# Patient Record
Sex: Female | Born: 1955
Health system: Southern US, Community
[De-identification: ages and names within clinical notes are randomized; demographics above are authoritative.]

## PROBLEM LIST (undated history)

## (undated) DIAGNOSIS — Z973 Presence of spectacles and contact lenses: Secondary | ICD-10-CM

## (undated) DIAGNOSIS — Z800 Family history of malignant neoplasm of digestive organs: Secondary | ICD-10-CM

## (undated) DIAGNOSIS — I10 Essential (primary) hypertension: Secondary | ICD-10-CM

## (undated) DIAGNOSIS — Z972 Presence of dental prosthetic device (complete) (partial): Secondary | ICD-10-CM

## (undated) HISTORY — PX: TUBAL LIGATION: SHX77

## (undated) HISTORY — PX: ORIF ANKLE FRACTURE: SUR919

## (undated) HISTORY — DX: Essential (primary) hypertension: I10

## (undated) HISTORY — PX: WISDOM TOOTH EXTRACTION: SHX21

## (undated) HISTORY — PX: COLONOSCOPY: SHX174

## (undated) HISTORY — PX: OTHER SURGICAL HISTORY: SHX169

## (undated) HISTORY — DX: Family history of malignant neoplasm of digestive organs: Z80.0

---

## 1987-06-05 HISTORY — PX: WRIST GANGLION EXCISION: SHX840

## 1998-12-08 ENCOUNTER — Other Ambulatory Visit: Admission: RE | Admit: 1998-12-08 | Discharge: 1998-12-08 | Payer: Self-pay | Admitting: Family Medicine

## 1998-12-29 ENCOUNTER — Encounter: Payer: Self-pay | Admitting: Family Medicine

## 1998-12-29 ENCOUNTER — Ambulatory Visit (HOSPITAL_COMMUNITY): Admission: RE | Admit: 1998-12-29 | Discharge: 1998-12-29 | Payer: Self-pay | Admitting: Family Medicine

## 1999-01-06 ENCOUNTER — Encounter: Payer: Self-pay | Admitting: Family Medicine

## 1999-01-06 ENCOUNTER — Ambulatory Visit (HOSPITAL_COMMUNITY): Admission: RE | Admit: 1999-01-06 | Discharge: 1999-01-06 | Payer: Self-pay | Admitting: Family Medicine

## 2000-08-08 ENCOUNTER — Encounter: Payer: Self-pay | Admitting: Family Medicine

## 2000-08-08 ENCOUNTER — Encounter: Admission: RE | Admit: 2000-08-08 | Discharge: 2000-08-08 | Payer: Self-pay | Admitting: Family Medicine

## 2001-02-06 ENCOUNTER — Other Ambulatory Visit: Admission: RE | Admit: 2001-02-06 | Discharge: 2001-02-06 | Payer: Self-pay | Admitting: Family Medicine

## 2001-02-18 ENCOUNTER — Encounter: Payer: Self-pay | Admitting: Family Medicine

## 2001-02-18 ENCOUNTER — Encounter: Admission: RE | Admit: 2001-02-18 | Discharge: 2001-02-18 | Payer: Self-pay | Admitting: Family Medicine

## 2002-05-04 ENCOUNTER — Other Ambulatory Visit: Admission: RE | Admit: 2002-05-04 | Discharge: 2002-05-04 | Payer: Self-pay | Admitting: Family Medicine

## 2002-12-31 ENCOUNTER — Other Ambulatory Visit: Admission: RE | Admit: 2002-12-31 | Discharge: 2002-12-31 | Payer: Self-pay | Admitting: *Deleted

## 2003-03-12 ENCOUNTER — Encounter: Payer: Self-pay | Admitting: Family Medicine

## 2003-03-12 ENCOUNTER — Encounter: Admission: RE | Admit: 2003-03-12 | Discharge: 2003-03-12 | Payer: Self-pay | Admitting: Family Medicine

## 2005-06-18 ENCOUNTER — Other Ambulatory Visit: Admission: RE | Admit: 2005-06-18 | Discharge: 2005-06-18 | Payer: Self-pay | Admitting: Family Medicine

## 2008-06-10 ENCOUNTER — Emergency Department (HOSPITAL_COMMUNITY): Admission: EM | Admit: 2008-06-10 | Discharge: 2008-06-10 | Payer: Self-pay | Admitting: Emergency Medicine

## 2008-06-14 ENCOUNTER — Ambulatory Visit: Payer: Self-pay | Admitting: Family Medicine

## 2008-06-21 ENCOUNTER — Ambulatory Visit: Payer: Self-pay | Admitting: Family Medicine

## 2008-07-26 ENCOUNTER — Ambulatory Visit: Payer: Self-pay | Admitting: Family Medicine

## 2008-08-23 ENCOUNTER — Ambulatory Visit: Payer: Self-pay | Admitting: Family Medicine

## 2008-08-24 ENCOUNTER — Encounter: Admission: RE | Admit: 2008-08-24 | Discharge: 2008-08-24 | Payer: Self-pay | Admitting: Family Medicine

## 2008-09-06 ENCOUNTER — Ambulatory Visit: Payer: Self-pay | Admitting: Family Medicine

## 2008-09-13 ENCOUNTER — Encounter: Admission: RE | Admit: 2008-09-13 | Discharge: 2008-09-13 | Payer: Self-pay | Admitting: Family Medicine

## 2008-12-13 ENCOUNTER — Ambulatory Visit: Payer: Self-pay | Admitting: Family Medicine

## 2009-01-13 ENCOUNTER — Ambulatory Visit: Payer: Self-pay | Admitting: Family Medicine

## 2009-04-14 ENCOUNTER — Ambulatory Visit: Payer: Self-pay | Admitting: Family Medicine

## 2009-07-25 ENCOUNTER — Ambulatory Visit: Payer: Self-pay | Admitting: Family Medicine

## 2009-09-29 IMAGING — US US ABDOMEN COMPLETE
1 series · 13 of 25 positions shown · non-contrast
Comparison: None.

CLINICAL DATA: Increased alkaline phosphatase.

ABDOMEN ULTRASOUND
TECHNIQUE: Complete abdominal ultrasound examination was performed
including evaluation of the liver, gallbladder, bile ducts,
pancreas, kidneys, spleen, IVC, and abdominal aorta.

[Series 1: us abdomen complete · 0.24mm/px · 13 of 63 slices shown]
[im 1/63]
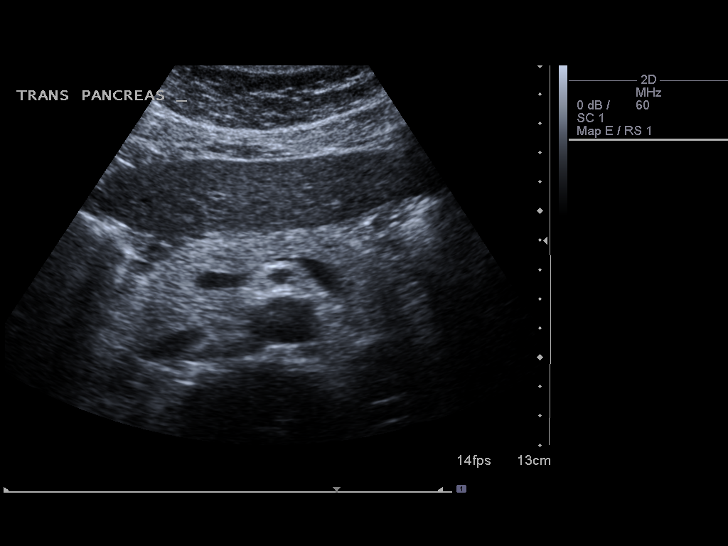
[im 6/63]
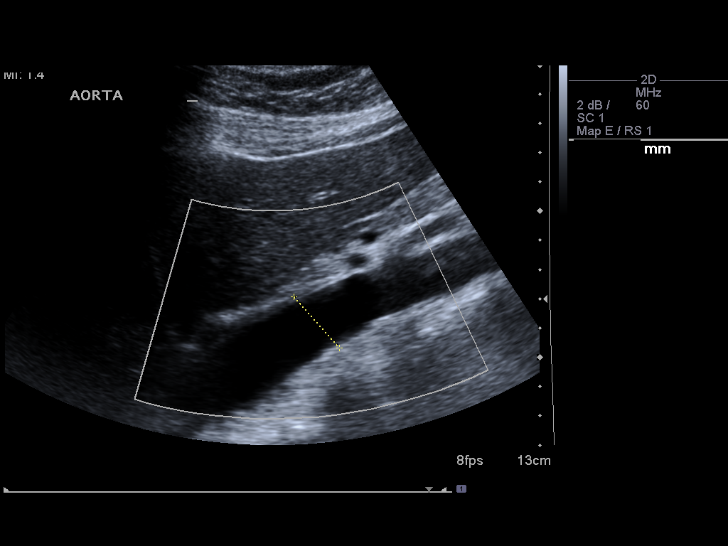
[im 11/63]
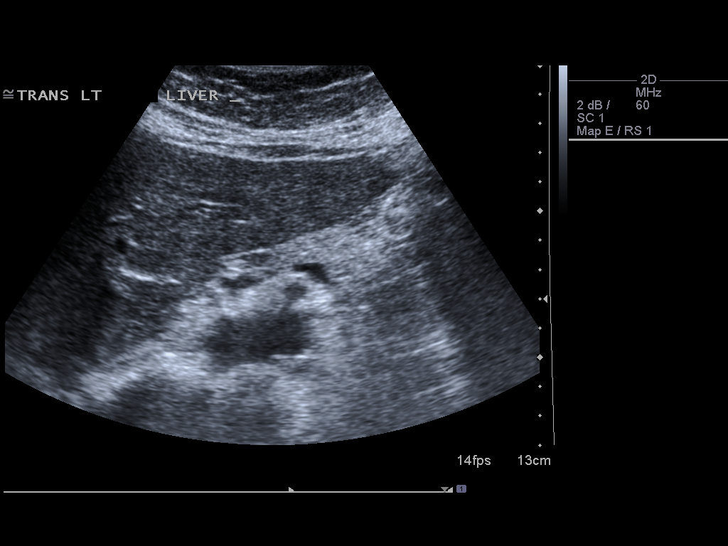
[im 16/63]
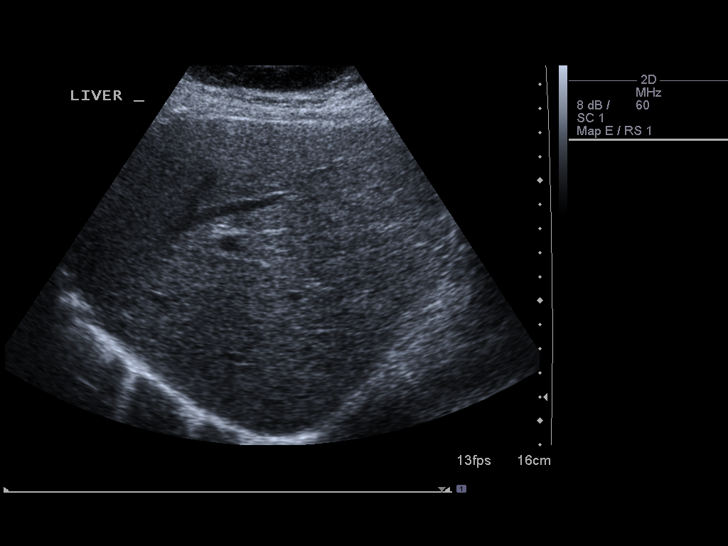
[im 21/63]
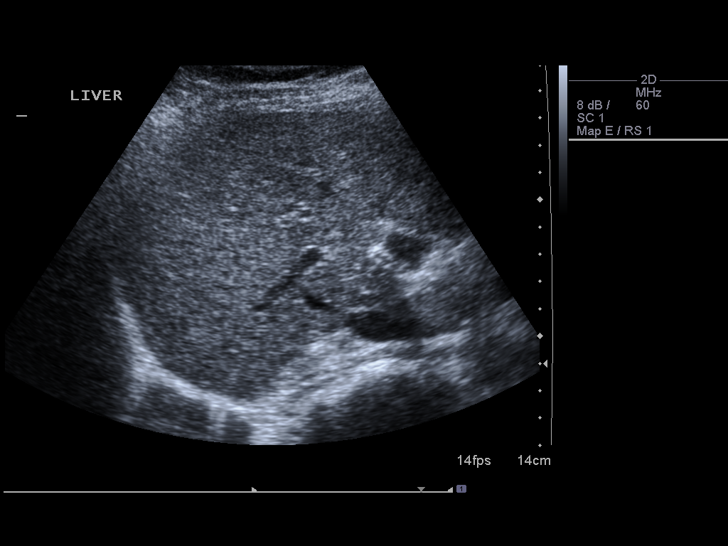
[im 26/63]
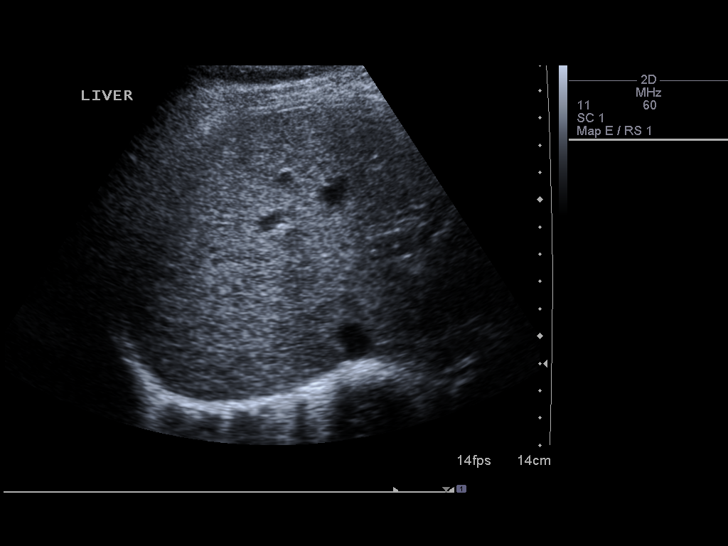
[im 32/63]
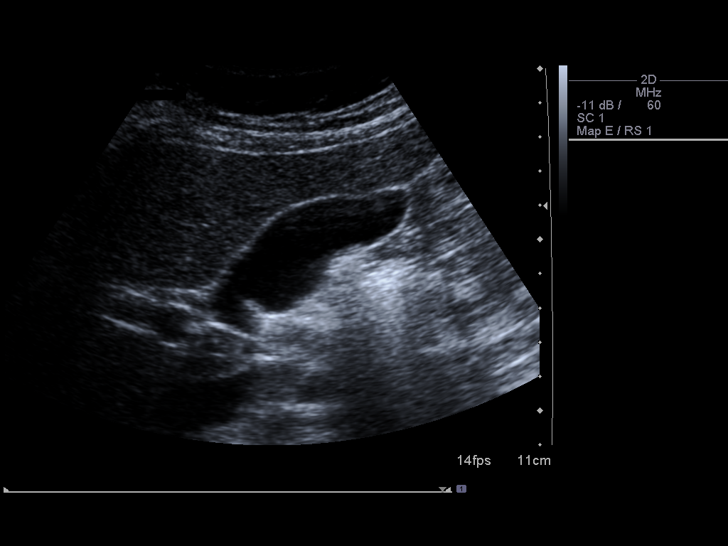
[im 37/63]
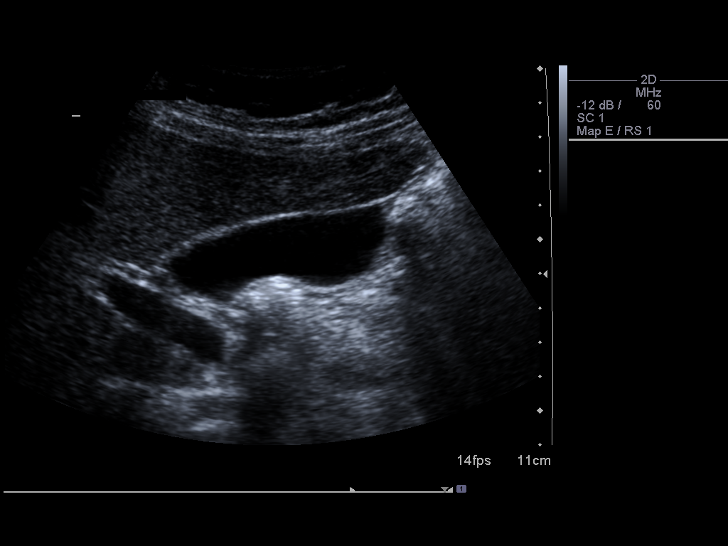
[im 42/63]
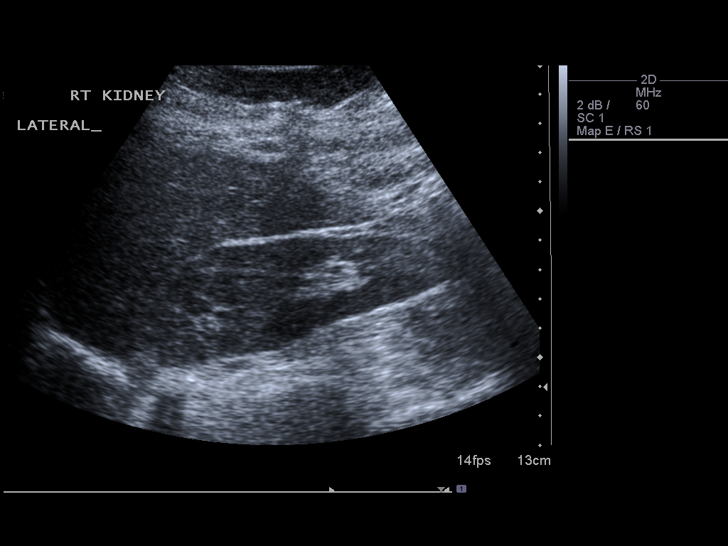
[im 47/63]
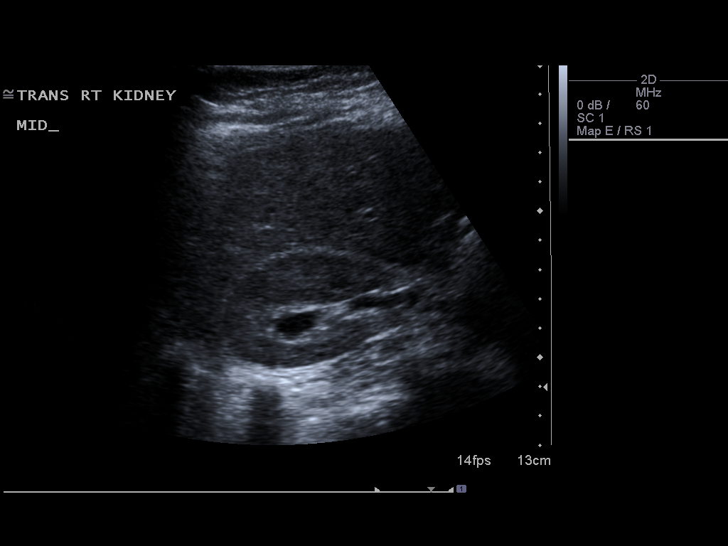
[im 52/63]
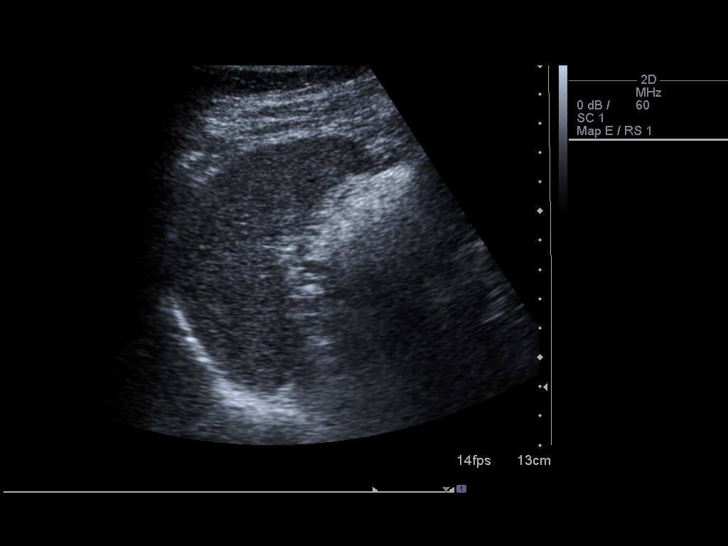
[im 57/63]
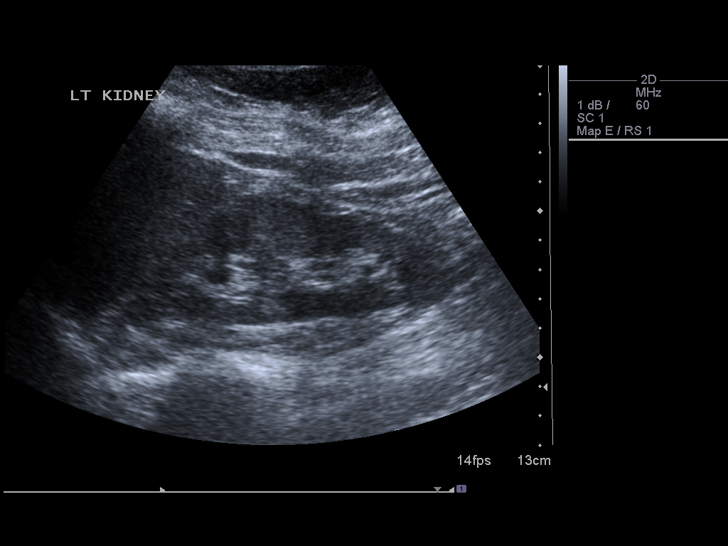
[im 63/63]
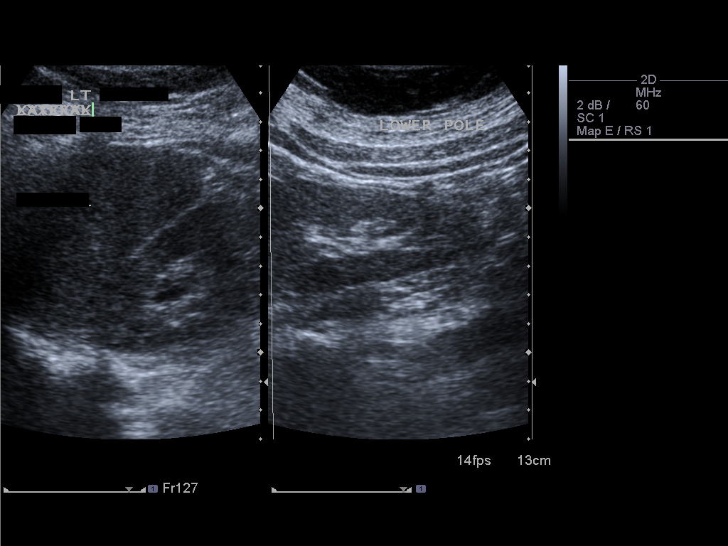

[13 of 25 positions shown; findings below may reference images not displayed]

FINDINGS: Gallbladder:  There is no evidence for gallstones, gallbladder wall
thickening or pericholecystic fluid.  The sonographer reports no
sonographic Murphy's sign.

Common Bile Duct:  Nondilated at 2-3 mm diameter

Liver:  There is some heterogeneity of the hepatic echotexture,
suggesting a degree of fatty infiltration.  No intrahepatic biliary
duct dilatation.

IVC:  Normal

Pancreas:  Normal

Spleen:  Normal

Right kidney:  11.1 cm in long axis.  Mild fullness of the right
intrarenal collecting system is noted with slight prominence of the
renal pelvis.

Left kidney:  11.5 cm in long axis.  Normal

Abdominal Aorta:  No evidence for aneurysm.
IMPRESSION: Normal gallbladder without evidence of biliary dilatation.

Heterogeneous hepatic echotexture suggests a degree of fatty
infiltration.

Mild fullness of the right intrarenal collecting system, a feature
of indeterminate significance.  No gross proximal hydroureter is
evident.  Correlation with renal function may prove helpful, and as
warranted, a follow-up ultrasound could be used to exclude
progression.

## 2010-09-18 LAB — POCT I-STAT, CHEM 8
BUN: 12 mg/dL (ref 6–23)
Calcium, Ion: 1.16 mmol/L (ref 1.12–1.32)
Chloride: 106 mEq/L (ref 96–112)
Potassium: 3.3 mEq/L — ABNORMAL LOW (ref 3.5–5.1)

## 2010-09-18 LAB — URINALYSIS, ROUTINE W REFLEX MICROSCOPIC
Glucose, UA: NEGATIVE mg/dL
Ketones, ur: NEGATIVE mg/dL
Specific Gravity, Urine: 1.012 (ref 1.005–1.030)
pH: 8 (ref 5.0–8.0)

## 2010-09-18 LAB — URINE MICROSCOPIC-ADD ON

## 2011-04-20 ENCOUNTER — Encounter: Payer: Self-pay | Admitting: Family Medicine

## 2011-06-18 ENCOUNTER — Ambulatory Visit (INDEPENDENT_AMBULATORY_CARE_PROVIDER_SITE_OTHER): Payer: 59 | Admitting: Family Medicine

## 2011-06-18 ENCOUNTER — Encounter: Payer: Self-pay | Admitting: Family Medicine

## 2011-06-18 ENCOUNTER — Ambulatory Visit: Payer: Self-pay | Admitting: Family Medicine

## 2011-06-18 ENCOUNTER — Other Ambulatory Visit: Payer: Self-pay | Admitting: Family Medicine

## 2011-06-18 DIAGNOSIS — E119 Type 2 diabetes mellitus without complications: Secondary | ICD-10-CM

## 2011-06-18 DIAGNOSIS — E1159 Type 2 diabetes mellitus with other circulatory complications: Secondary | ICD-10-CM

## 2011-06-18 DIAGNOSIS — I10 Essential (primary) hypertension: Secondary | ICD-10-CM

## 2011-06-18 DIAGNOSIS — E1169 Type 2 diabetes mellitus with other specified complication: Secondary | ICD-10-CM | POA: Insufficient documentation

## 2011-06-18 DIAGNOSIS — Z733 Stress, not elsewhere classified: Secondary | ICD-10-CM

## 2011-06-18 DIAGNOSIS — Z23 Encounter for immunization: Secondary | ICD-10-CM

## 2011-06-18 DIAGNOSIS — Z9119 Patient's noncompliance with other medical treatment and regimen: Secondary | ICD-10-CM

## 2011-06-18 DIAGNOSIS — I152 Hypertension secondary to endocrine disorders: Secondary | ICD-10-CM | POA: Insufficient documentation

## 2011-06-18 DIAGNOSIS — F439 Reaction to severe stress, unspecified: Secondary | ICD-10-CM

## 2011-06-18 LAB — LIPID PANEL
Cholesterol: 184 mg/dL (ref 0–200)
LDL Cholesterol: 107 mg/dL — ABNORMAL HIGH (ref 0–99)
Total CHOL/HDL Ratio: 4.3 Ratio
VLDL: 34 mg/dL (ref 0–40)

## 2011-06-18 LAB — CBC WITH DIFFERENTIAL/PLATELET
Basophils Absolute: 0 10*3/uL (ref 0.0–0.1)
Basophils Relative: 0 % (ref 0–1)
Eosinophils Absolute: 0.2 10*3/uL (ref 0.0–0.7)
Eosinophils Relative: 2 % (ref 0–5)
MCH: 28.6 pg (ref 26.0–34.0)
MCHC: 32.2 g/dL (ref 30.0–36.0)
MCV: 88.9 fL (ref 78.0–100.0)
Neutrophils Relative %: 43 % (ref 43–77)
Platelets: 253 10*3/uL (ref 150–400)
RBC: 4.23 MIL/uL (ref 3.87–5.11)
RDW: 13.2 % (ref 11.5–15.5)

## 2011-06-18 LAB — COMPREHENSIVE METABOLIC PANEL
ALT: 18 U/L (ref 0–35)
Alkaline Phosphatase: 165 U/L — ABNORMAL HIGH (ref 39–117)
Creat: 0.78 mg/dL (ref 0.50–1.10)
Sodium: 140 mEq/L (ref 135–145)
Total Bilirubin: 0.4 mg/dL (ref 0.3–1.2)
Total Protein: 7.4 g/dL (ref 6.0–8.3)

## 2011-06-18 MED ORDER — AMLODIPINE BESYLATE 10 MG PO TABS: 10.0000 mg | ORAL_TABLET | Freq: Every day | ORAL | Status: DC

## 2011-06-18 MED ORDER — OLMESARTAN MEDOXOMIL-HCTZ 40-12.5 MG PO TABS: 1.0000 | ORAL_TABLET | Freq: Every day | ORAL | Status: DC

## 2011-06-18 NOTE — Patient Instructions (Signed)
Get back on your blood pressure medications. Return at the end of the week for recheck and to review your bloodwork.

## 2011-06-18 NOTE — Progress Notes (Signed)
  Subjective:    Patient ID: Caswell Corwin, female    DOB: [birth year], 56 y.o.   MRN: [id number]  HPI She is here for consultation concerning recent blood pressure. Apparently was taken and she has readings in the 120 range. She stopped taking her blood pressure medication about a year ago. She states that she has been taking care of her mother who now has terminal cancer. Review of her record indicates she also has diabetes and has not followed up on this. When asked why she could not give me a good reason.   Review of Systems     Objective:   Physical Exam Alert and in no distress. Cardiac exam shows regular rhythm without murmurs or gallops. Lungs clear to auscultation. EKG       Assessment & Plan:   1. Diabetes mellitus  CBC with Differential, Comprehensive metabolic panel, Lipid panel, PR ELECTROCARDIOGRAM, COMPLETE, HgB A1c  2. Hypertension associated with diabetes  CBC with Differential, Comprehensive metabolic panel, Lipid panel, PR ELECTROCARDIOGRAM, COMPLETE  3. Stress    4. Personal history of noncompliance with medical treatment, presenting hazards to health      hospice will become involved in taking care of her mother. I strongly encouraged her to redirect her priorities and make sure that taking care of herself becomes her new main goal. Recheck here in several days.

## 2011-06-22 ENCOUNTER — Ambulatory Visit: Payer: 59 | Admitting: Family Medicine

## 2011-06-25 ENCOUNTER — Ambulatory Visit: Payer: 59 | Admitting: Family Medicine

## 2011-06-25 LAB — HEMOGLOBIN A1C: Mean Plasma Glucose: 192 mg/dL — ABNORMAL HIGH (ref <117)

## 2011-06-26 NOTE — Progress Notes (Signed)
Make sure that she has an appt

## 2011-07-13 ENCOUNTER — Ambulatory Visit (INDEPENDENT_AMBULATORY_CARE_PROVIDER_SITE_OTHER): Payer: 59 | Admitting: Family Medicine

## 2011-07-13 ENCOUNTER — Encounter: Payer: Self-pay | Admitting: Family Medicine

## 2011-07-13 DIAGNOSIS — I10 Essential (primary) hypertension: Secondary | ICD-10-CM

## 2011-07-13 DIAGNOSIS — E1169 Type 2 diabetes mellitus with other specified complication: Secondary | ICD-10-CM

## 2011-07-13 DIAGNOSIS — E1159 Type 2 diabetes mellitus with other circulatory complications: Secondary | ICD-10-CM

## 2011-07-13 DIAGNOSIS — I152 Hypertension secondary to endocrine disorders: Secondary | ICD-10-CM

## 2011-07-13 DIAGNOSIS — E119 Type 2 diabetes mellitus without complications: Secondary | ICD-10-CM

## 2011-07-13 NOTE — Progress Notes (Signed)
  Subjective:    Patient ID: Aimee Sharp, female    DOB: 01-Aug-1955, 56 y.o.   MRN: [id number]  HPI She is here for recheck. She continues on her blood pressure medication and is having no difficulty with this. She has been checking her blood sugar fairly regularly at home and has been getting readings in the 140/80-90 range. She keeps herself quite active with work. She does not smoke.   Review of Systems     Objective:   Physical Exam Alert and in no distress. Blood pressure is recorded.       Assessment & Plan:   1. Hypertension associated with diabetes   2. Diabetes mellitus    encouraged her to continue on her present medication regimen as well as physical activities. She does not eat much salt. She will return here in 3 months and if the blood pressure is not under good control, I LAD another medicine.

## 2011-07-13 NOTE — Patient Instructions (Signed)
Stay on all your medications and check you again in 3 months

## 2011-10-12 ENCOUNTER — Encounter: Payer: Self-pay | Admitting: Family Medicine

## 2011-10-12 ENCOUNTER — Ambulatory Visit (INDEPENDENT_AMBULATORY_CARE_PROVIDER_SITE_OTHER): Payer: 59 | Admitting: Family Medicine

## 2011-10-12 DIAGNOSIS — E1169 Type 2 diabetes mellitus with other specified complication: Secondary | ICD-10-CM

## 2011-10-12 DIAGNOSIS — I152 Hypertension secondary to endocrine disorders: Secondary | ICD-10-CM

## 2011-10-12 DIAGNOSIS — I10 Essential (primary) hypertension: Secondary | ICD-10-CM

## 2011-10-12 DIAGNOSIS — Z9119 Patient's noncompliance with other medical treatment and regimen: Secondary | ICD-10-CM

## 2011-10-12 DIAGNOSIS — Z79899 Other long term (current) drug therapy: Secondary | ICD-10-CM

## 2011-10-12 DIAGNOSIS — E785 Hyperlipidemia, unspecified: Secondary | ICD-10-CM | POA: Insufficient documentation

## 2011-10-12 DIAGNOSIS — E119 Type 2 diabetes mellitus without complications: Secondary | ICD-10-CM

## 2011-10-12 MED ORDER — ATORVASTATIN CALCIUM 20 MG PO TABS: 20.0000 mg | ORAL_TABLET | Freq: Every day | ORAL | Status: DC

## 2011-10-12 MED ORDER — CARVEDILOL 6.25 MG PO TABS: 6.2500 mg | ORAL_TABLET | Freq: Two times a day (BID) | ORAL | Status: DC

## 2011-10-12 MED ORDER — METFORMIN HCL 500 MG PO TABS: 500.0000 mg | ORAL_TABLET | Freq: Two times a day (BID) | ORAL | Status: DC

## 2011-10-12 NOTE — Progress Notes (Signed)
  Subjective:    Patient ID: Aimee Sharp, female    DOB: February 23, 1956, 56 y.o.   MRN: [id number]  HPI She is here for recheck on her blood pressure. Review of her record indicates she also has diabetes however she admits to not being compliant with her overall care. She cites taking care of her mother but does state that her mother died recently. She has been through nutritional counseling however eats out quite frequently and has made no changes in her diet habits. She does not check her blood sugars.   Review of Systems     Objective:   Physical Exam Alert and in no distress. Blood pressure is recorded.       Assessment & Plan:   1. Diabetes mellitus  metFORMIN (GLUCOPHAGE) 500 MG tablet  2. Hypertension associated with diabetes  carvedilol (COREG) 6.25 MG tablet  3. Hyperlipidemia LDL goal <70  atorvastatin (LIPITOR) 20 MG tablet  4. Encounter for long-term (current) use of other medications    5. Personal history of noncompliance with medical treatment, presenting hazards to health     over 20 minutes spent discussing diabetes care in regard to diet, exercise, medications. At this point she seems unwilling to make any major changes in her lifestyle. I will therefore add metformin, Lipitor and Coreg to her regimen. Recheck here in about one month .

## 2011-11-16 ENCOUNTER — Ambulatory Visit (INDEPENDENT_AMBULATORY_CARE_PROVIDER_SITE_OTHER): Payer: 59 | Admitting: Family Medicine

## 2011-11-16 VITALS — BP 126/80 | HR 80 | Wt 159.0 lb

## 2011-11-16 DIAGNOSIS — E1169 Type 2 diabetes mellitus with other specified complication: Secondary | ICD-10-CM

## 2011-11-16 DIAGNOSIS — I152 Hypertension secondary to endocrine disorders: Secondary | ICD-10-CM

## 2011-11-16 DIAGNOSIS — E119 Type 2 diabetes mellitus without complications: Secondary | ICD-10-CM

## 2011-11-16 DIAGNOSIS — I10 Essential (primary) hypertension: Secondary | ICD-10-CM

## 2011-11-16 MED ORDER — METFORMIN HCL ER (MOD) 500 MG PO TB24: 500.0000 mg | ORAL_TABLET | Freq: Every day | ORAL | Status: DC

## 2011-11-16 NOTE — Patient Instructions (Signed)
Stop the metformin that you're taking right now and start the new one can take at one twice a day. They on all your other medicines will see you in  3 months

## 2011-11-16 NOTE — Progress Notes (Signed)
  Subjective:    Patient ID: Aimee Sharp, female    DOB: 10-Apr-1956, 56 y.o.   MRN: [id number]  HPI She is here for recheck. She states that she is having difficulty from diarrhea otherwise seems to be doing fairly well.   Review of Systems     Objective:   Physical Exam Alert and in no distress otherwise not examined. Blood pressure is recorded.     Assessment & Plan:   1. Diabetes mellitus  metFORMIN (GLUMETZA) 500 MG (MOD) 24 hr tablet  2. Hypertension associated with diabetes     switch to metformin ER and continue other medications. Recheck here in about 3 months.

## 2011-11-19 ENCOUNTER — Telehealth: Payer: Self-pay | Admitting: Internal Medicine

## 2011-11-19 MED ORDER — METFORMIN HCL ER 500 MG PO TB24: 500.0000 mg | ORAL_TABLET | Freq: Every day | ORAL | Status: DC

## 2011-11-19 NOTE — Telephone Encounter (Signed)
Sent new med in

## 2011-11-19 NOTE — Telephone Encounter (Signed)
I want her to have a generic extended release metformin. Work with the pharmacy on this

## 2012-02-15 ENCOUNTER — Encounter: Payer: Self-pay | Admitting: Family Medicine

## 2012-02-15 ENCOUNTER — Ambulatory Visit (INDEPENDENT_AMBULATORY_CARE_PROVIDER_SITE_OTHER): Payer: 59 | Admitting: Family Medicine

## 2012-02-15 VITALS — BP 120/80 | HR 80 | Wt 159.0 lb

## 2012-02-15 DIAGNOSIS — E119 Type 2 diabetes mellitus without complications: Secondary | ICD-10-CM

## 2012-02-15 DIAGNOSIS — Z9119 Patient's noncompliance with other medical treatment and regimen: Secondary | ICD-10-CM

## 2012-02-15 LAB — POCT GLYCOSYLATED HEMOGLOBIN (HGB A1C): Hemoglobin A1C: 9.4

## 2012-02-15 MED ORDER — ALOGLIPTIN-PIOGLITAZONE 25-30 MG PO TABS: 1.0000 | ORAL_TABLET | ORAL | Status: DC

## 2012-02-15 NOTE — Patient Instructions (Signed)
Periodically check your blood sugars either before a meal or 2 hours after a meal. What your blood sugars 2 hours after a meal to be under 180 and before meals it should be in the low 100s

## 2012-02-15 NOTE — Progress Notes (Signed)
  Subjective:    Patient ID: Aimee Sharp, female    DOB: [birth year], 56 y.o.   MRN: [id number]  HPI She is here for recheck. She had difficulty with the metformin causing diarrhea in spite of being placed on a long-acting variety. She stopped the medication but did not call me. She has not been checking her blood sugars at all. She states her work keeps her busy physically. She continues on her other medications.   Review of Systems     Objective:   Physical Exam Alert and in no distress. Hemoglobin A1c is 9.4.      Assessment & Plan:   1. Diabetes mellitus  POCT glycosylated hemoglobin (Hb A1C)  2. Personal history of noncompliance with medical treatment, presenting hazards to health     I talked to her about noncompliance in regard to diet, exercise, checking her blood sugars. I will place her on Oseni. She is to call me if she has any difficulties. Discussed the need for her to periodically check her blood sugars. She stated that she was then have the nurse work help and I explained that it would be best for her to do this.

## 2012-03-29 ENCOUNTER — Other Ambulatory Visit: Payer: Self-pay | Admitting: Family Medicine

## 2012-05-16 ENCOUNTER — Ambulatory Visit: Payer: 59 | Admitting: Family Medicine

## 2012-05-26 ENCOUNTER — Ambulatory Visit (INDEPENDENT_AMBULATORY_CARE_PROVIDER_SITE_OTHER): Payer: 59 | Admitting: Family Medicine

## 2012-05-26 ENCOUNTER — Encounter: Payer: Self-pay | Admitting: Family Medicine

## 2012-05-26 VITALS — BP 114/80 | HR 75 | Ht 62.0 in | Wt 162.0 lb

## 2012-05-26 DIAGNOSIS — E1169 Type 2 diabetes mellitus with other specified complication: Secondary | ICD-10-CM

## 2012-05-26 DIAGNOSIS — I152 Hypertension secondary to endocrine disorders: Secondary | ICD-10-CM

## 2012-05-26 DIAGNOSIS — I10 Essential (primary) hypertension: Secondary | ICD-10-CM

## 2012-05-26 DIAGNOSIS — E119 Type 2 diabetes mellitus without complications: Secondary | ICD-10-CM

## 2012-05-26 DIAGNOSIS — E785 Hyperlipidemia, unspecified: Secondary | ICD-10-CM

## 2012-05-26 LAB — CBC WITH DIFFERENTIAL/PLATELET
Basophils Absolute: 0 10*3/uL (ref 0.0–0.1)
Basophils Relative: 1 % (ref 0–1)
HCT: 35.3 % — ABNORMAL LOW (ref 36.0–46.0)
Lymphocytes Relative: 53 % — ABNORMAL HIGH (ref 12–46)
MCHC: 33.1 g/dL (ref 30.0–36.0)
Neutro Abs: 2.5 10*3/uL (ref 1.7–7.7)
Neutrophils Relative %: 40 % — ABNORMAL LOW (ref 43–77)
RDW: 14.2 % (ref 11.5–15.5)
WBC: 6.1 10*3/uL (ref 4.0–10.5)

## 2012-05-26 LAB — LIPID PANEL
HDL: 45 mg/dL (ref >39)
LDL Cholesterol: 55 mg/dL (ref 0–99)
Triglycerides: 68 mg/dL (ref <150)
VLDL: 14 mg/dL (ref 0–40)

## 2012-05-26 LAB — COMPREHENSIVE METABOLIC PANEL
ALT: 13 U/L (ref 0–35)
AST: 16 U/L (ref 0–37)
Albumin: 4.3 g/dL (ref 3.5–5.2)
Alkaline Phosphatase: 124 U/L — ABNORMAL HIGH (ref 39–117)
Calcium: 9.8 mg/dL (ref 8.4–10.5)
Chloride: 104 mEq/L (ref 96–112)
Potassium: 3.7 mEq/L (ref 3.5–5.3)
Sodium: 141 mEq/L (ref 135–145)
Total Protein: 7.4 g/dL (ref 6.0–8.3)

## 2012-05-26 MED ORDER — ALOGLIPTIN-PIOGLITAZONE 25-30 MG PO TABS: 1.0000 | ORAL_TABLET | ORAL | Status: DC

## 2012-05-26 NOTE — Progress Notes (Signed)
  Subjective:    Patient ID: Aimee Sharp, female    DOB: 01/13/1956, 56 y.o.   MRN: [id number]  HPI She is here for recheck. She has not been checking her blood sugars stating she has had difficulty with her blood sugar machine. She has been taking her medications without any difficulty. She has no other concerns or complaints.   Review of Systems     Objective:   Physical Exam Alert and in no distress. Hemoglobin A1c is 7.1.       Assessment & Plan:   1. Diabetes mellitus  POCT glycosylated hemoglobin (Hb A1C), Alogliptin-Pioglitazone (OSENI) 25-30 MG TABS, Lipid panel, CBC with Differential, Comprehensive metabolic panel  2. Hypertension associated with diabetes  CBC with Differential, Comprehensive metabolic panel  3. Hyperlipidemia LDL goal <70  Lipid panel   I again stressed the need for her to become more proactive and she has difficulty with medications or with checking her blood sugars, she is to let us know. Otherwise I will see her back here in 4 months. My nurse did go for proper use of the glucometer with her.

## 2012-05-29 NOTE — Progress Notes (Signed)
Quick Note:  PT INFORMED AND VERBALIZED UNDERSTANDING  ______ 

## 2012-07-19 ENCOUNTER — Other Ambulatory Visit: Payer: Self-pay | Admitting: Family Medicine

## 2012-08-28 ENCOUNTER — Other Ambulatory Visit: Payer: Self-pay | Admitting: Family Medicine

## 2012-09-26 ENCOUNTER — Encounter: Payer: Self-pay | Admitting: Family Medicine

## 2012-09-26 ENCOUNTER — Ambulatory Visit (INDEPENDENT_AMBULATORY_CARE_PROVIDER_SITE_OTHER): Payer: 59 | Admitting: Family Medicine

## 2012-09-26 VITALS — BP 130/84 | HR 74 | Wt 165.0 lb

## 2012-09-26 DIAGNOSIS — E1169 Type 2 diabetes mellitus with other specified complication: Secondary | ICD-10-CM

## 2012-09-26 DIAGNOSIS — E669 Obesity, unspecified: Secondary | ICD-10-CM

## 2012-09-26 DIAGNOSIS — I152 Hypertension secondary to endocrine disorders: Secondary | ICD-10-CM

## 2012-09-26 DIAGNOSIS — Z9119 Patient's noncompliance with other medical treatment and regimen: Secondary | ICD-10-CM

## 2012-09-26 DIAGNOSIS — E785 Hyperlipidemia, unspecified: Secondary | ICD-10-CM

## 2012-09-26 DIAGNOSIS — E119 Type 2 diabetes mellitus without complications: Secondary | ICD-10-CM

## 2012-09-26 DIAGNOSIS — I10 Essential (primary) hypertension: Secondary | ICD-10-CM

## 2012-09-26 DIAGNOSIS — E1159 Type 2 diabetes mellitus with other circulatory complications: Secondary | ICD-10-CM

## 2012-09-26 NOTE — Progress Notes (Signed)
  Subjective:    Patient ID: Aimee Sharp, female    DOB: [birth year], 57 y.o.   MRN: [id number]  HPI She is here for a diabetes check. She does not check her sugars regularly. She blames this on her work schedule. She does work 2 jobs. She also eats fast foods  Due to her work schedule. She continues on medications listed in the chart. Social and family history were reviewed.   Review of Systems     Objective:   Physical Exam Alert and in no distress. Hemoglobin A1c is 7.5.       Assessment & Plan:  Diabetes mellitus - Plan: Amb Referral to Nutrition and Diabetic E, HgB A1c  Hypertension associated with diabetes  Hyperlipidemia LDL goal <70 - Plan: Amb Referral to Nutrition and Diabetic E  Obesity (BMI 30-39.9) - Plan: Amb Referral to Nutrition and Diabetic E  Personal history of noncompliance with medical treatment, presenting hazards to health over 20 minutes spent discussing diabetes care with her. I pointed out that she is placing work above herself and that her health. Her because of this. Strongly encouraged her to get more balance in her life. She has not been through diabetes education and I will therefore refer her for that. Encouraged her to check her blood sugars more regularly. Recheck here in several months.

## 2012-09-26 NOTE — Patient Instructions (Signed)
There needs to be more balance in your life. You need to take better care of herself

## 2012-10-24 ENCOUNTER — Encounter: Payer: 59 | Attending: Family Medicine | Admitting: *Deleted

## 2012-10-24 ENCOUNTER — Encounter: Payer: Self-pay | Admitting: *Deleted

## 2012-10-24 VITALS — Ht 63.0 in | Wt 166.1 lb

## 2012-10-24 DIAGNOSIS — Z713 Dietary counseling and surveillance: Secondary | ICD-10-CM | POA: Insufficient documentation

## 2012-10-24 DIAGNOSIS — E119 Type 2 diabetes mellitus without complications: Secondary | ICD-10-CM | POA: Insufficient documentation

## 2012-10-24 NOTE — Patient Instructions (Addendum)
Plan:  Aim for 3 Carb Choices per meal (45 grams) +/- 1 either way  Aim for 0-2 Carbs per snack if hungry  Consider reading food labels for Total Carbohydrate of foods Consider bringing your meter to our next visit so we can discuss further

## 2012-10-24 NOTE — Progress Notes (Signed)
  Medical Nutrition Therapy:  Appt start time: 0945 end time:  1045.  Assessment:  Primary concerns today: patient here for diabetes education. Lives alone, doesn't cook much, eats out often. 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 She states she has to wear steel toed shoes which are uncomfortable. 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 SMBG infrequently due to schedule and unsure how to use her meter. She did not bring it today for me to show her how. Enjoys bowling on Saturdays and tournament bowls   MEDICATIONS: see list, current diabetes mediation is Oseni  DIETARY INTAKE:  Usual eating pattern includes 3 meals and 2 snacks per day.  Everyday foods include fair variety of all food groups plus sweets daily.  Avoided foods include. None stated.    24-hr recall:  B ( AM): makes a breakfast sandwich with egg, bacon or bologna OR egg and meat biscuit from [employer] and grits, sweet tea or flavored powder to water. Snk ( AM): cookies or candy, chips  L ( PM): brings sandwich from home, chips, flavored water Snk ( PM): same as AM D ( PM): on way home from 2nd job picks up supper; combo meal or salad with sweet tea or may eat at sister's house if invited Snk ( PM): none Beverages: sweet tea or flavored powder to water.  Usual physical activity: active as Location manager and will walk up to 2 miles on occasion  Estimated energy needs: 1400 calories 158 g carbohydrates 105 g protein 39 g fat  Progress Towards Goal(s):  In progress.   Nutritional Diagnosis:  NB-1.1 Food and nutrition-related knowledge deficit As related to diabetes management.  As evidenced by A1c of 7.5% on 09/26/2012.    Intervention:  Nutrition counseling and diabetes education initiated. Discussed basic physiology of diabetes, SMBG and rationale of checking BG at alternate times of day, A1c, Carb Counting and reading food labels, and  benefits of increased activity. Encouraged her to bring her meter to our next appointment so I can show her how to use it.  Plan:  Aim for 3 Carb Choices per meal (45 grams) +/- 1 either way  Aim for 0-2 Carbs per snack if hungry  Consider reading food labels for Total Carbohydrate of foods Consider bringing your meter to our next visit so we can discuss further   Handouts given during visit include: Living Well with Diabetes Carb Counting and Food Label handouts Meal Plan Card  Monitoring/Evaluation:  Dietary intake, exercise, reading food labels, and body weight in 6 week(s).

## 2012-12-01 ENCOUNTER — Ambulatory Visit: Payer: 59 | Admitting: *Deleted

## 2013-01-23 ENCOUNTER — Ambulatory Visit: Payer: 59 | Admitting: Medical

## 2013-02-09 ENCOUNTER — Ambulatory Visit: Payer: 59 | Admitting: Medical

## 2013-02-20 ENCOUNTER — Encounter: Payer: Self-pay | Admitting: Family Medicine

## 2013-02-20 ENCOUNTER — Ambulatory Visit (INDEPENDENT_AMBULATORY_CARE_PROVIDER_SITE_OTHER): Payer: 59 | Admitting: Family Medicine

## 2013-02-20 VITALS — BP 160/100 | HR 80 | Wt 162.0 lb

## 2013-02-20 DIAGNOSIS — I10 Essential (primary) hypertension: Secondary | ICD-10-CM

## 2013-02-20 DIAGNOSIS — E1159 Type 2 diabetes mellitus with other circulatory complications: Secondary | ICD-10-CM

## 2013-02-20 DIAGNOSIS — E785 Hyperlipidemia, unspecified: Secondary | ICD-10-CM

## 2013-02-20 DIAGNOSIS — I152 Hypertension secondary to endocrine disorders: Secondary | ICD-10-CM

## 2013-02-20 DIAGNOSIS — E1169 Type 2 diabetes mellitus with other specified complication: Secondary | ICD-10-CM

## 2013-02-20 DIAGNOSIS — Z9119 Patient's noncompliance with other medical treatment and regimen: Secondary | ICD-10-CM

## 2013-02-20 DIAGNOSIS — Z23 Encounter for immunization: Secondary | ICD-10-CM

## 2013-02-20 DIAGNOSIS — E119 Type 2 diabetes mellitus without complications: Secondary | ICD-10-CM

## 2013-02-20 DIAGNOSIS — Z91199 Patient's noncompliance with other medical treatment and regimen due to unspecified reason: Secondary | ICD-10-CM

## 2013-02-20 NOTE — Progress Notes (Signed)
Subjective:    Aimee Sharp is a 57 y.o. female who presents for follow-up of Type 2 diabetes mellitus.                Home blood sugar records: Not known patient is not check them.  Current symptoms/problems NONE Daily foot checks: 0   Any foot concerns: Last eye exam:  12/02/12 [facility]   Medication compliance: She has not been taking her medications regularly and in fact skipped 2 weeks towards the end of August. Current diet: NONE Current exercise: WALKING EVERY OTHER DAY Known diabetic complications: none Cardiovascular risk factors: dyslipidemia and hypertension   The following portions of the patient's history were reviewed and updated as appropriate: allergies, current medications, past family history, past medical history, past social history and problem list.  ROS as in subjective above    Objective:    BP 160/100  Pulse 80  Wt 162 lb (73.483 kg)  BMI 28.7 kg/m2  Filed Vitals:   02/20/13 0918  BP: 160/100  Pulse: 80    General appearence: alert, no distress, WD/WN Hemoglobin A1c is 8.2   Lab Review Lab Results  Component Value Date   HGBA1C 7.5% 09/26/2012   Lab Results  Component Value Date   CHOL 114 05/26/2012   HDL 45 05/26/2012   LDLCALC 55 05/26/2012   TRIG 68 05/26/2012   CHOLHDL 2.5 05/26/2012   No results found for this basenameConcepcion Elk     Chemistry      Component Value Date/Time   NA 141 05/26/2012 1100   K 3.7 05/26/2012 1100   CL 104 05/26/2012 1100   CO2 27 05/26/2012 1100   BUN 13 05/26/2012 1100   CREATININE 0.88 05/26/2012 1100   CREATININE 1.1 06/10/2008 1549      Component Value Date/Time   CALCIUM 9.8 05/26/2012 1100   ALKPHOS 124* 05/26/2012 1100   AST 16 05/26/2012 1100   ALT 13 05/26/2012 1100   BILITOT 0.7 05/26/2012 1100        Chemistry      Component Value Date/Time   NA 141 05/26/2012 1100   K 3.7 05/26/2012 1100   CL 104 05/26/2012 1100   CO2 27 05/26/2012 1100   BUN 13 05/26/2012 1100    CREATININE 0.88 05/26/2012 1100   CREATININE 1.1 06/10/2008 1549      Component Value Date/Time   CALCIUM 9.8 05/26/2012 1100   ALKPHOS 124* 05/26/2012 1100   AST 16 05/26/2012 1100   ALT 13 05/26/2012 1100   BILITOT 0.7 05/26/2012 1100       Last optometry/ophthalmology exam reviewed from:    Assessment:  Diabetes mellitus - Plan: POCT glycosylated hemoglobin (Hb A1C)  Personal history of noncompliance with medical treatment, presenting hazards to health  Need for prophylactic vaccination and inoculation against influenza - Plan: Flu Vaccine QUAD 36+ mos IM  Hyperlipidemia LDL goal <70  Hypertension associated with diabetes        Plan:    1.  Rx changes: none 2.  Education: Reviewed 'ABCs' of diabetes management (respective goals in parentheses):  A1C (<7), blood pressure (<130/80), and cholesterol (LDL <100). 3.  Compliance at present is estimated to be poor. Efforts to improve compliance (if necessary) will be directed at none. 4. Follow up: 4 months  I discussed her noncompliance in great detail. Discussed the fact that she needs to take her medications and check her blood sugars on a regular basis. I used the analogy of stopping  at stop lights and stop signs to help realize that she does not need to like this but just do it. Encouraged her to check her blood sugars either before a meal or 2 hours after a meal. Flu shot given with risks and benefits discussed

## 2013-02-20 NOTE — Patient Instructions (Signed)
Forget liking it or just liking it; just do it. Check your glucometer and see if it needs a new battery. Periodically check your blood sugar you before a meal or 2 hours after a meal

## 2013-02-25 ENCOUNTER — Other Ambulatory Visit: Payer: Self-pay | Admitting: Family Medicine

## 2013-04-18 ENCOUNTER — Other Ambulatory Visit: Payer: Self-pay | Admitting: Family Medicine

## 2013-04-23 ENCOUNTER — Encounter (HOSPITAL_BASED_OUTPATIENT_CLINIC_OR_DEPARTMENT_OTHER): Payer: Self-pay | Admitting: *Deleted

## 2013-04-23 NOTE — Progress Notes (Signed)
To come in for [id number]  

## 2013-04-24 ENCOUNTER — Encounter (HOSPITAL_BASED_OUTPATIENT_CLINIC_OR_DEPARTMENT_OTHER)
Admission: RE | Admit: 2013-04-24 | Discharge: 2013-04-24 | Disposition: A | Discharge status: Home / Self Care | Payer: 59 | Source: Ambulatory Visit | Attending: Orthopedic Surgery | Admitting: Orthopedic Surgery

## 2013-04-24 DIAGNOSIS — Z01810 Encounter for preprocedural cardiovascular examination: Secondary | ICD-10-CM | POA: Insufficient documentation

## 2013-04-24 DIAGNOSIS — Z01818 Encounter for other preprocedural examination: Secondary | ICD-10-CM | POA: Insufficient documentation

## 2013-04-24 DIAGNOSIS — Z01812 Encounter for preprocedural laboratory examination: Secondary | ICD-10-CM | POA: Insufficient documentation

## 2013-04-24 LAB — BASIC METABOLIC PANEL
BUN: 11 mg/dL (ref 6–23)
CO2: 29 mEq/L (ref 19–32)
Calcium: 9.7 mg/dL (ref 8.4–10.5)
Creatinine, Ser: 0.8 mg/dL (ref 0.50–1.10)
GFR calc non Af Amer: 80 mL/min — ABNORMAL LOW (ref >90)
Glucose, Bld: 141 mg/dL — ABNORMAL HIGH (ref 70–99)
Potassium: 3.7 mEq/L (ref 3.5–5.1)
Sodium: 140 mEq/L (ref 135–145)

## 2013-04-27 NOTE — H&P (Signed)
Aimee Sharp is an 57 y.o. female.   Chief Complaint: LEFT THUMB MASS HPI: PT FOLLOWED IN OFFICE PT WITH ENLARGING MASS OVER THUMB PT WOULD LIKE TO HAVE REMOVED PT HERE FOR SURGERY TO REMOVE MASS NO PRIOR HISTORY OF REMOVAL  Past Medical History  Diagnosis Date  . Hypertension   . Diabetes mellitus   . Wears glasses     read  . Wears partial dentures     upper partial    Past Surgical History  Procedure Laterality Date  . No surgeries to date    . Wrist ganglion excision  1989    right  . Tubal ligation    . Wisdom tooth extraction    . Orif ankle fracture      right in college  . Colonoscopy      History reviewed. No pertinent family history. Social History:  reports that she has never smoked. She has never used smokeless tobacco. She reports that she does not drink alcohol or use illicit drugs.  Allergies: No Known Allergies  No prescriptions prior to admission    No results found for this or any previous visit (from the past 48 hour(s)). No results found.  Review of Systems  Genitourinary: Positive for flank pain.   NO RECENT ILLNESSES OR HOSPITALIZATIONS  Height 5\' 3"  (1.6 m), weight 73.483 kg (162 lb). Physical Exam  General Appearance:  Alert, cooperative, no distress, appears stated age  Head:  Normocephalic, without obvious abnormality, atraumatic  Eyes:  Pupils equal, conjunctiva/corneas clear,         Throat: Lips, mucosa, and tongue normal; teeth and gums normal  Neck: No visible masses     Lungs:   respirations unlabored  Chest Wall:  No tenderness or deformity  Heart:  Regular rate and rhythm,  Abdomen:   Soft, non-tender,         Extremities: LEFT THUMB: THUMB WARM WELL PERFUSED ABLE TO EXTEND THUMB AND FLEX THUMB IP JOINT GOOD DIGITAL MOTION VOLAR MASS OVER THUMB MP JOINT  Pulses: 2+ and symmetric  Skin: Skin color, texture, turgor normal, no rashes or lesions     Neurologic: Normal    Assessment/Plan LEFT THUMB MASS  LEFT THUMB  MASS EXCISION  R/B/A DISCUSSED WITH PT IN OFFICE.  PT VOICED UNDERSTANDING OF PLAN CONSENT SIGNED DAY OF SURGERY PT SEEN AND EXAMINED PRIOR TO OPERATIVE PROCEDURE/DAY OF SURGERY SITE MARKED. QUESTIONS ANSWERED WILL GO HOME FOLLOWING SURGERY  Sharma Covert 04/29/2013 at 1049 am

## 2013-04-29 ENCOUNTER — Encounter (HOSPITAL_BASED_OUTPATIENT_CLINIC_OR_DEPARTMENT_OTHER): Payer: Self-pay | Admitting: *Deleted

## 2013-04-29 ENCOUNTER — Encounter (HOSPITAL_BASED_OUTPATIENT_CLINIC_OR_DEPARTMENT_OTHER)
Admission: RE | Disposition: A | Discharge status: Home / Self Care | Payer: Self-pay | Source: Ambulatory Visit | Attending: Orthopedic Surgery

## 2013-04-29 ENCOUNTER — Ambulatory Visit (HOSPITAL_BASED_OUTPATIENT_CLINIC_OR_DEPARTMENT_OTHER)
Admission: RE | Admit: 2013-04-29 | Discharge: 2013-04-29 | Disposition: A | Discharge status: Home / Self Care | Payer: 59 | Source: Ambulatory Visit | Attending: Orthopedic Surgery | Admitting: Orthopedic Surgery

## 2013-04-29 ENCOUNTER — Encounter (HOSPITAL_BASED_OUTPATIENT_CLINIC_OR_DEPARTMENT_OTHER): Payer: 59 | Admitting: Anesthesiology

## 2013-04-29 ENCOUNTER — Ambulatory Visit (HOSPITAL_BASED_OUTPATIENT_CLINIC_OR_DEPARTMENT_OTHER): Payer: 59 | Admitting: Anesthesiology

## 2013-04-29 DIAGNOSIS — I10 Essential (primary) hypertension: Secondary | ICD-10-CM | POA: Insufficient documentation

## 2013-04-29 DIAGNOSIS — M65312 Trigger thumb, left thumb: Secondary | ICD-10-CM

## 2013-04-29 DIAGNOSIS — Z01810 Encounter for preprocedural cardiovascular examination: Secondary | ICD-10-CM | POA: Insufficient documentation

## 2013-04-29 DIAGNOSIS — M6740 Ganglion, unspecified site: Secondary | ICD-10-CM | POA: Insufficient documentation

## 2013-04-29 DIAGNOSIS — Z01812 Encounter for preprocedural laboratory examination: Secondary | ICD-10-CM | POA: Insufficient documentation

## 2013-04-29 DIAGNOSIS — E119 Type 2 diabetes mellitus without complications: Secondary | ICD-10-CM | POA: Insufficient documentation

## 2013-04-29 HISTORY — DX: Presence of dental prosthetic device (complete) (partial): Z97.2

## 2013-04-29 HISTORY — DX: Presence of spectacles and contact lenses: Z97.3

## 2013-04-29 HISTORY — PX: MASS EXCISION: SHX2000

## 2013-04-29 LAB — GLUCOSE, CAPILLARY: Glucose-Capillary: 103 mg/dL — ABNORMAL HIGH (ref 70–99)

## 2013-04-29 LAB — POCT HEMOGLOBIN-HEMACUE: Hemoglobin: 11.4 g/dL — ABNORMAL LOW (ref 12.0–15.0)

## 2013-04-29 SURGERY — EXCISION MASS
Anesthesia: General | Site: Thumb | Laterality: Left | Wound class: Clean

## 2013-04-29 MED ORDER — LACTATED RINGERS IV SOLN
INTRAVENOUS | Status: DC
  Administered 2013-04-29 (×2): via INTRAVENOUS

## 2013-04-29 MED ORDER — BUPIVACAINE HCL (PF) 0.5 % IJ SOLN
INTRAMUSCULAR | Status: AC
  Filled 2013-04-29: qty 30

## 2013-04-29 MED ORDER — DOCUSATE SODIUM 100 MG PO CAPS: 100.0000 mg | ORAL_CAPSULE | Freq: Two times a day (BID) | ORAL | Status: DC

## 2013-04-29 MED ORDER — OXYCODONE HCL 5 MG/5ML PO SOLN: 5.0000 mg | Freq: Once | ORAL | Status: DC | PRN

## 2013-04-29 MED ORDER — FENTANYL CITRATE 0.05 MG/ML IJ SOLN
INTRAMUSCULAR | Status: AC
  Filled 2013-04-29: qty 4

## 2013-04-29 MED ORDER — LIDOCAINE HCL (CARDIAC) 20 MG/ML IV SOLN
INTRAVENOUS | Status: DC | PRN
  Administered 2013-04-29: 75 mg via INTRAVENOUS

## 2013-04-29 MED ORDER — MIDAZOLAM HCL 2 MG/2ML IJ SOLN
INTRAMUSCULAR | Status: AC
  Filled 2013-04-29: qty 2

## 2013-04-29 MED ORDER — MIDAZOLAM HCL 5 MG/5ML IJ SOLN
INTRAMUSCULAR | Status: DC | PRN
  Administered 2013-04-29: 2 mg via INTRAVENOUS

## 2013-04-29 MED ORDER — BUPIVACAINE HCL (PF) 0.5 % IJ SOLN
INTRAMUSCULAR | Status: DC | PRN
  Administered 2013-04-29: 9 mL

## 2013-04-29 MED ORDER — HYDROMORPHONE HCL PF 1 MG/ML IJ SOLN: 0.2500 mg | INTRAMUSCULAR | Status: DC | PRN

## 2013-04-29 MED ORDER — CEFAZOLIN SODIUM-DEXTROSE 2-3 GM-% IV SOLR
2.0000 g | INTRAVENOUS | Status: AC
  Administered 2013-04-29: 2 g via INTRAVENOUS

## 2013-04-29 MED ORDER — BUPIVACAINE HCL (PF) 0.25 % IJ SOLN
INTRAMUSCULAR | Status: AC
  Filled 2013-04-29: qty 30

## 2013-04-29 MED ORDER — HYDROCODONE-ACETAMINOPHEN 5-300 MG PO TABS: 1.0000 | ORAL_TABLET | Freq: Four times a day (QID) | ORAL | Status: DC | PRN

## 2013-04-29 MED ORDER — CEFAZOLIN SODIUM-DEXTROSE 2-3 GM-% IV SOLR
INTRAVENOUS | Status: AC
  Filled 2013-04-29: qty 50

## 2013-04-29 MED ORDER — FENTANYL CITRATE 0.05 MG/ML IJ SOLN
INTRAMUSCULAR | Status: DC | PRN
  Administered 2013-04-29: 100 ug via INTRAVENOUS
  Administered 2013-04-29: 50 ug via INTRAVENOUS

## 2013-04-29 MED ORDER — OXYCODONE HCL 5 MG PO TABS: 5.0000 mg | ORAL_TABLET | Freq: Once | ORAL | Status: DC | PRN

## 2013-04-29 MED ORDER — ONDANSETRON HCL 4 MG/2ML IJ SOLN
INTRAMUSCULAR | Status: DC | PRN
  Administered 2013-04-29: 10 mg via INTRAVENOUS

## 2013-04-29 MED ORDER — CHLORHEXIDINE GLUCONATE 4 % EX LIQD: 60.0000 mL | Freq: Once | CUTANEOUS | Status: DC

## 2013-04-29 MED ORDER — PROPOFOL 10 MG/ML IV BOLUS
INTRAVENOUS | Status: DC | PRN
  Administered 2013-04-29: 200 mg via INTRAVENOUS

## 2013-04-29 SURGICAL SUPPLY — 52 items
APL SKNCLS STERI-STRIP NONHPOA (GAUZE/BANDAGES/DRESSINGS) ×1
BANDAGE COBAN STERILE 2 (GAUZE/BANDAGES/DRESSINGS) ×1 IMPLANT
BANDAGE CONFORM 2  STR LF (GAUZE/BANDAGES/DRESSINGS) ×1 IMPLANT
BANDAGE ELASTIC 3 VELCRO ST LF (GAUZE/BANDAGES/DRESSINGS) ×2 IMPLANT
BANDAGE ELASTIC 4 VELCRO ST LF (GAUZE/BANDAGES/DRESSINGS) IMPLANT
BANDAGE GAUZE STRT 1 STR LF (GAUZE/BANDAGES/DRESSINGS) IMPLANT
BENZOIN TINCTURE PRP APPL 2/3 (GAUZE/BANDAGES/DRESSINGS) ×2 IMPLANT
BLADE SURG 15 STRL LF DISP TIS (BLADE) ×1 IMPLANT
BLADE SURG 15 STRL SS (BLADE) ×2
BNDG CMPR 9X4 STRL LF SNTH (GAUZE/BANDAGES/DRESSINGS)
BNDG ESMARK 4X9 LF (GAUZE/BANDAGES/DRESSINGS) IMPLANT
CORDS BIPOLAR (ELECTRODE) ×2 IMPLANT
COVER MAYO STAND STRL (DRAPES) ×2 IMPLANT
COVER TABLE BACK 60X90 (DRAPES) ×2 IMPLANT
CUFF TOURNIQUET SINGLE 18IN (TOURNIQUET CUFF) ×1 IMPLANT
DECANTER SPIKE VIAL GLASS SM (MISCELLANEOUS) IMPLANT
DRAPE EXTREMITY T 121X128X90 (DRAPE) ×2 IMPLANT
DRAPE SURG 17X23 STRL (DRAPES) ×2 IMPLANT
DRSG EMULSION OIL 3X3 NADH (GAUZE/BANDAGES/DRESSINGS) ×2 IMPLANT
GLOVE BIOGEL PI IND STRL 6.5 (GLOVE) IMPLANT
GLOVE BIOGEL PI IND STRL 7.0 (GLOVE) IMPLANT
GLOVE BIOGEL PI IND STRL 8.5 (GLOVE) ×1 IMPLANT
GLOVE BIOGEL PI INDICATOR 6.5 (GLOVE) ×1
GLOVE BIOGEL PI INDICATOR 7.0 (GLOVE) ×1
GLOVE BIOGEL PI INDICATOR 8.5 (GLOVE) ×1
GLOVE SURG ORTHO 8.0 STRL STRW (GLOVE) ×2 IMPLANT
GOWN PREVENTION PLUS XLARGE (GOWN DISPOSABLE) ×2 IMPLANT
GOWN PREVENTION PLUS XXLARGE (GOWN DISPOSABLE) ×2 IMPLANT
NDL HYPO 25X1 1.5 SAFETY (NEEDLE) ×1 IMPLANT
NEEDLE HYPO 25X1 1.5 SAFETY (NEEDLE) ×2 IMPLANT
NS IRRIG 1000ML POUR BTL (IV SOLUTION) ×2 IMPLANT
PACK BASIN DAY SURGERY FS (CUSTOM PROCEDURE TRAY) ×2 IMPLANT
PAD CAST 3X4 CTTN HI CHSV (CAST SUPPLIES) ×1 IMPLANT
PADDING CAST ABS 3INX4YD NS (CAST SUPPLIES)
PADDING CAST ABS 4INX4YD NS (CAST SUPPLIES)
PADDING CAST ABS COTTON 3X4 (CAST SUPPLIES) ×1 IMPLANT
PADDING CAST ABS COTTON 4X4 ST (CAST SUPPLIES) ×1 IMPLANT
PADDING CAST COTTON 3X4 STRL (CAST SUPPLIES)
SPLINT PLASTER CAST XFAST 3X15 (CAST SUPPLIES) IMPLANT
SPLINT PLASTER XTRA FASTSET 3X (CAST SUPPLIES)
SPONGE GAUZE 2X2 8PLY STRL LF (GAUZE/BANDAGES/DRESSINGS) ×1 IMPLANT
SPONGE GAUZE 4X4 12PLY (GAUZE/BANDAGES/DRESSINGS) ×2 IMPLANT
STOCKINETTE 4X48 STRL (DRAPES) ×2 IMPLANT
STRIP CLOSURE SKIN 1/2X4 (GAUZE/BANDAGES/DRESSINGS) IMPLANT
SUT MNCRL AB 3-0 PS2 18 (SUTURE) ×2 IMPLANT
SUT MNCRL AB 4-0 PS2 18 (SUTURE) IMPLANT
SUT PROLENE 4 0 PS 2 18 (SUTURE) IMPLANT
SYR BULB 3OZ (MISCELLANEOUS) ×2 IMPLANT
SYR CONTROL 10ML LL (SYRINGE) ×2 IMPLANT
TOWEL OR 17X24 6PK STRL BLUE (TOWEL DISPOSABLE) ×2 IMPLANT
TRAY DSU PREP LF (CUSTOM PROCEDURE TRAY) ×2 IMPLANT
UNDERPAD 30X30 INCONTINENT (UNDERPADS AND DIAPERS) ×2 IMPLANT

## 2013-04-29 NOTE — Anesthesia Preprocedure Evaluation (Addendum)
Anesthesia Evaluation  Patient identified by MRN, date of birth, ID band Patient awake    Reviewed: Allergy & Precautions, H&P , NPO status , Patient's Chart, lab work & pertinent test results  Airway Mallampati: II TM Distance: >3 FB Neck ROM: Full    Dental no notable dental hx. (+) Partial Upper and Dental Advisory Given   Pulmonary neg pulmonary ROS,  breath sounds clear to auscultation  Pulmonary exam normal       Cardiovascular hypertension, On Medications Rhythm:Regular Rate:Normal     Neuro/Psych negative neurological ROS  negative psych ROS   GI/Hepatic negative GI ROS, Neg liver ROS,   Endo/Other  diabetes, Type 2, Oral Hypoglycemic Agents  Renal/GU negative Renal ROS  negative genitourinary   Musculoskeletal   Abdominal   Peds  Hematology negative hematology ROS (+)   Anesthesia Other Findings   Reproductive/Obstetrics negative OB ROS                          Anesthesia Physical Anesthesia Plan  ASA: II  Anesthesia Plan: General   Post-op Pain Management:    Induction: Intravenous  Airway Management Planned: LMA  Additional Equipment:   Intra-op Plan:   Post-operative Plan: Extubation in OR  Informed Consent: I have reviewed the patients History and Physical, chart, labs and discussed the procedure including the risks, benefits and alternatives for the proposed anesthesia with the patient or authorized representative who has indicated his/her understanding and acceptance.   Dental advisory given  Plan Discussed with: CRNA  Anesthesia Plan Comments:         Anesthesia Quick Evaluation

## 2013-04-29 NOTE — Brief Op Note (Signed)
04/29/2013  10:55 AM  PATIENT:  Aimee Sharp  57 y.o. female  PRE-OPERATIVE DIAGNOSIS:  LEFT THUMB MASS  POST-OPERATIVE DIAGNOSIS:  same  PROCEDURE:  Procedure(s): LEFT THUMB DEEP MASS EXCISION (Left)  SURGEON:  Surgeon(s) and Role:    * Sharma Covert, MD - Primary  PHYSICIAN ASSISTANT:   ASSISTANTS: none   ANESTHESIA:   MAC  EBL:     BLOOD ADMINISTERED:none  DRAINS: none   LOCAL MEDICATIONS USED:  MARCAINE     SPECIMEN:  No Specimen  DISPOSITION OF SPECIMEN:  N/A  COUNTS:  YES  TOURNIQUET:    DICTATION: .[id number]  PLAN OF CARE: Discharge to home after PACU  PATIENT DISPOSITION:  PACU - hemodynamically stable.   Delay start of Pharmacological VTE agent (>24hrs) due to surgical blood loss or risk of bleeding: not applicable

## 2013-04-29 NOTE — Anesthesia Postprocedure Evaluation (Signed)
  Anesthesia Post-op Note  Patient: Aimee Sharp  Procedure(s) Performed: Procedure(s): LEFT THUMB DEEP MASS EXCISION (Left)  Patient Location: PACU  Anesthesia Type:General  Level of Consciousness: awake and alert   Airway and Oxygen Therapy: Patient Spontanous Breathing  Post-op Pain: none  Post-op Assessment: Post-op Vital signs reviewed, Patient's Cardiovascular Status Stable and Respiratory Function Stable  Post-op Vital Signs: Reviewed  Filed Vitals:   04/29/13 1319  BP: 122/66  Pulse: 74  Temp: 36.6 C  Resp: 16    Complications: No apparent anesthesia complications

## 2013-05-01 NOTE — Op Note (Signed)
Aimee Sharp, SERGENT NO.:  [id number]  MEDICAL RECORD NO.:  [id number]  LOCATION:                               FACILITY:  [facility]  PHYSICIAN:  Madelynn Done, MD  DATE OF BIRTH:  1955-08-06  DATE OF PROCEDURE:  04/29/2013 DATE OF DISCHARGE:  04/29/2013                              OPERATIVE REPORT   PREOPERATIVE DIAGNOSIS:  Left thumb volar mass, less than 3 cm deep mass.  POSTOPERATIVE DIAGNOSIS:  Left thumb volar mass, less than 3 cm deep mass.  ATTENDING PHYSICIAN:  Madelynn Done, MD, who scrubbed and present for the entire procedure.  ASSISTANT SURGEON:  None.  ANESTHESIA:  General via LMA.  SURGICAL PROCEDURE: 1. Left thumb volar mass excision of the mass less than 3 cm, greater     than 1-1/2 cm 2. Left thumb A1 pulley release.  SURGICAL INDICATIONS:  [name] is a right-hand-dominant female with enlarging mass over the volar aspect of her left thumb.  The patient was seen and evaluated in the office and recommended to undergo the above procedure.  Risks, benefits, and alternatives were discussed in detail with the patient and signed informed consent was obtained.  Risks include, but not limited to bleeding, infection, damage to nearby nerves, arteries, or tendons, loss of motion of wrist and digits, incomplete relief of symptoms, and need for further surgical intervention.  DESCRIPTION OF PROCEDURE:  The patient was properly identified in the preop holding area and mark with a permanent marker made on left thumb to indicate correct operative site.  The patient was then brought back to the operating room, placed supine on the anesthesia room table, where general anesthetic was administered.  The patient received preoperative antibiotics prior to skin incision.  A well-padded tourniquet was then placed on the left brachium and sealed with 1000 drape.  Left upper extremity was then prepped and draped in normal sterile fashion.   Time- out was called, the correct side was identified, and procedure was then begun.  Attention was then turned to left thumb, where a transverse incision was made directly over the MP crease.  Limb was then elevated and tourniquet insufflated.  Deep dissection was carried down through the skin and subcutaneous tissue.  The volar mass was deep to the subcutaneous tissue along the A1 pulley.  The mass was then circumferentially dissected and then carefully removed.  Portion of the A1 pulley was then excised with it.  The remaining portion of the A1 pulley was then released.  After complete release of the A1 pulley, excision of greater than 1-1/2 cm mass.  The wound was then thoroughly irrigated.  Copious wound irrigation done throughout.  No other abnormalities were noted to the flexor sheath.  The skin was then closed using horizontal mattress, Prolene sutures.  Adaptic dressing, sterile compressive bandage was then applied.  The patient tolerated the procedure well, was then placed in the small bandage and then taken to recovery room in good condition.  POSTPROCEDURE PLAN:  The patient discharged to home, will be seen back in the office in approximately 2 weeks for wound check, suture removal, and gradually use and activity.  PATHOLOGY:  Volar mass to Pathology.     Madelynn Done, MD   ______________________________ Madelynn Done, MD    FWO/MEDQ  D:  04/29/2013  T:  04/29/2013  Job:  [id number]

## 2013-05-04 ENCOUNTER — Encounter (HOSPITAL_BASED_OUTPATIENT_CLINIC_OR_DEPARTMENT_OTHER): Payer: Self-pay | Admitting: Orthopedic Surgery

## 2013-05-07 ENCOUNTER — Encounter (HOSPITAL_BASED_OUTPATIENT_CLINIC_OR_DEPARTMENT_OTHER): Payer: Self-pay | Admitting: Orthopedic Surgery

## 2013-05-07 NOTE — Transfer of Care (Signed)
Immediate Anesthesia Transfer of Care Note  Patient: Aimee Sharp  Procedure(s) Performed: Procedure(s): LEFT THUMB DEEP MASS EXCISION (Left)  Patient Location: PACU  Anesthesia Type:General  Level of Consciousness: awake and patient cooperative  Airway & Oxygen Therapy: Patient Spontanous Breathing and Patient connected to face mask oxygen  Post-op Assessment: Report given to PACU RN and Post -op Vital signs reviewed and stable  Post vital signs: Reviewed and stable  Complications: No apparent anesthesia complications

## 2013-06-19 ENCOUNTER — Ambulatory Visit: Payer: 59 | Admitting: Family Medicine

## 2013-07-03 ENCOUNTER — Other Ambulatory Visit: Payer: Self-pay | Admitting: Family Medicine

## 2013-08-24 ENCOUNTER — Other Ambulatory Visit: Payer: Self-pay | Admitting: Family Medicine

## 2014-02-27 ENCOUNTER — Other Ambulatory Visit: Payer: Self-pay | Admitting: Family Medicine

## 2014-06-14 ENCOUNTER — Other Ambulatory Visit: Payer: Self-pay | Admitting: Family Medicine

## 2014-06-17 ENCOUNTER — Ambulatory Visit (INDEPENDENT_AMBULATORY_CARE_PROVIDER_SITE_OTHER): Payer: 59 | Admitting: Family Medicine

## 2014-06-17 ENCOUNTER — Encounter: Payer: Self-pay | Admitting: Family Medicine

## 2014-06-17 VITALS — BP 146/80 | HR 74 | Ht 62.5 in | Wt 165.0 lb

## 2014-06-17 DIAGNOSIS — Z23 Encounter for immunization: Secondary | ICD-10-CM

## 2014-06-17 DIAGNOSIS — I152 Hypertension secondary to endocrine disorders: Secondary | ICD-10-CM

## 2014-06-17 DIAGNOSIS — E785 Hyperlipidemia, unspecified: Secondary | ICD-10-CM

## 2014-06-17 DIAGNOSIS — E1159 Type 2 diabetes mellitus with other circulatory complications: Secondary | ICD-10-CM

## 2014-06-17 DIAGNOSIS — E1169 Type 2 diabetes mellitus with other specified complication: Secondary | ICD-10-CM

## 2014-06-17 DIAGNOSIS — Z9119 Patient's noncompliance with other medical treatment and regimen: Secondary | ICD-10-CM

## 2014-06-17 DIAGNOSIS — I10 Essential (primary) hypertension: Secondary | ICD-10-CM

## 2014-06-17 DIAGNOSIS — Z91199 Patient's noncompliance with other medical treatment and regimen due to unspecified reason: Secondary | ICD-10-CM

## 2014-06-17 DIAGNOSIS — E119 Type 2 diabetes mellitus without complications: Secondary | ICD-10-CM

## 2014-06-17 DIAGNOSIS — M25571 Pain in right ankle and joints of right foot: Secondary | ICD-10-CM

## 2014-06-17 LAB — LIPID PANEL
Cholesterol: 121 mg/dL (ref 0–200)
HDL: 40 mg/dL (ref >39)
LDL Cholesterol: 63 mg/dL (ref 0–99)
Total CHOL/HDL Ratio: 3 Ratio
Triglycerides: 92 mg/dL (ref <150)
VLDL: 18 mg/dL (ref 0–40)

## 2014-06-17 LAB — CBC WITH DIFFERENTIAL/PLATELET
BASOS ABS: 0 10*3/uL (ref 0.0–0.1)
BASOS PCT: 0 % (ref 0–1)
EOS PCT: 2 % (ref 0–5)
Eosinophils Absolute: 0.1 10*3/uL (ref 0.0–0.7)
HCT: 35.2 % — ABNORMAL LOW (ref 36.0–46.0)
HEMOGLOBIN: 11.6 g/dL — AB (ref 12.0–15.0)
LYMPHS PCT: 50 % — AB (ref 12–46)
Lymphs Abs: 3.6 10*3/uL (ref 0.7–4.0)
MCH: 28.9 pg (ref 26.0–34.0)
MCHC: 33 g/dL (ref 30.0–36.0)
MCV: 87.6 fL (ref 78.0–100.0)
MONO ABS: 0.5 10*3/uL (ref 0.1–1.0)
MPV: 10.7 fL (ref 8.6–12.4)
Monocytes Relative: 7 % (ref 3–12)
NEUTROS PCT: 41 % — AB (ref 43–77)
Neutro Abs: 2.9 10*3/uL (ref 1.7–7.7)
PLATELETS: 293 10*3/uL (ref 150–400)
RBC: 4.02 MIL/uL (ref 3.87–5.11)
RDW: 13.7 % (ref 11.5–15.5)
WBC: 7.1 10*3/uL (ref 4.0–10.5)

## 2014-06-17 LAB — COMPREHENSIVE METABOLIC PANEL
ALK PHOS: 146 U/L — AB (ref 39–117)
ALT: 13 U/L (ref 0–35)
AST: 13 U/L (ref 0–37)
Albumin: 3.8 g/dL (ref 3.5–5.2)
BUN: 11 mg/dL (ref 6–23)
CHLORIDE: 104 meq/L (ref 96–112)
CO2: 29 mEq/L (ref 19–32)
Calcium: 9.1 mg/dL (ref 8.4–10.5)
Creat: 0.74 mg/dL (ref 0.50–1.10)
Glucose, Bld: 126 mg/dL — ABNORMAL HIGH (ref 70–99)
Potassium: 3.7 mEq/L (ref 3.5–5.3)
SODIUM: 141 meq/L (ref 135–145)
Total Bilirubin: 0.4 mg/dL (ref 0.2–1.2)
Total Protein: 7.2 g/dL (ref 6.0–8.3)

## 2014-06-17 LAB — POCT GLYCOSYLATED HEMOGLOBIN (HGB A1C): Hemoglobin A1C: 7.2

## 2014-06-17 MED ORDER — CARVEDILOL 6.25 MG PO TABS: 6.2500 mg | ORAL_TABLET | Freq: Two times a day (BID) | ORAL | Status: DC

## 2014-06-17 MED ORDER — AMLODIPINE BESYLATE 10 MG PO TABS: 10.0000 mg | ORAL_TABLET | Freq: Every day | ORAL | Status: DC

## 2014-06-17 MED ORDER — ALOGLIPTIN-PIOGLITAZONE 25-30 MG PO TABS: 1.0000 | ORAL_TABLET | Freq: Every day | ORAL | Status: DC

## 2014-06-17 MED ORDER — OLMESARTAN MEDOXOMIL-HCTZ 40-12.5 MG PO TABS: 1.0000 | ORAL_TABLET | Freq: Every day | ORAL | Status: DC

## 2014-06-17 NOTE — Patient Instructions (Signed)
Heat for 20 minutes 3 times per day. 2 Aleve twice per day and do this for several weeks. If you're still having trouble then come back and we'll reevaluate

## 2014-06-17 NOTE — Progress Notes (Deleted)
   Subjective:    Patient ID: Aimee Sharp, female    DOB: 1955-07-04, 59 y.o.   MRN: [id number]  HPI    Review of Systems     Objective:   Physical Exam        Assessment & Plan:

## 2014-06-17 NOTE — Progress Notes (Signed)
Subjective:    Aimee Sharp is a 59 y.o. female who presents for follow-up of Type 2 diabetes mellitus.  She has not been here in over a year. She cites surgeries and family issues that were more important in her life. She has been through diabetes education program but admits to not following the recommendations. Home blood sugar records: NONE  Current symptoms/problems include difficulty with right ankle pain. This bothers her mainly in the morning. She is able to do her dancing which includes line dancing and ballroom dancing. She has been doing the ballroom dancing for almost a year. No history of injury.  Daily foot checks:no Last eye exam:  2014   Medication compliance: poor Current diet: NONE Current exercise: dancing,ballroom and line 3 x week Known diabetic complications: none Cardiovascular risk factors: diabetes mellitus, dyslipidemia and hypertension   The following portions of the patient's history were reviewed and updated as appropriate: allergies, current medications, past family history, past social history and problem list.  ROS as in subjective above    Objective:    General appearence: alert, no distress, WD/WN Right ankle exam shows no swelling with normal motion. No laxity. No point tenderness.  Lab Review Lab Results  Component Value Date   HGBA1C 8.2 02/20/2013   Lab Results  Component Value Date   CHOL 114 05/26/2012   HDL 45 05/26/2012   LDLCALC 55 05/26/2012   TRIG 68 05/26/2012   CHOLHDL 2.5 05/26/2012   No results found for: Derl Barrow   Chemistry      Component Value Date/Time   NA 140 04/24/2013 1100   K 3.7 04/24/2013 1100   CL 102 04/24/2013 1100   CO2 29 04/24/2013 1100   BUN 11 04/24/2013 1100   CREATININE 0.80 04/24/2013 1100   CREATININE 0.88 05/26/2012 1100      Component Value Date/Time   CALCIUM 9.7 04/24/2013 1100   ALKPHOS 124* 05/26/2012 1100   AST 16 05/26/2012 1100   ALT 13 05/26/2012 1100   BILITOT 0.7  05/26/2012 1100        Chemistry      Component Value Date/Time   NA 140 04/24/2013 1100   K 3.7 04/24/2013 1100   CL 102 04/24/2013 1100   CO2 29 04/24/2013 1100   BUN 11 04/24/2013 1100   CREATININE 0.80 04/24/2013 1100   CREATININE 0.88 05/26/2012 1100      Component Value Date/Time   CALCIUM 9.7 04/24/2013 1100   ALKPHOS 124* 05/26/2012 1100   AST 16 05/26/2012 1100   ALT 13 05/26/2012 1100   BILITOT 0.7 05/26/2012 1100     Hemoglobin A1c 7.2    Assessment:  Type 2 diabetes mellitus without complication - Plan: CBC with Differential, Comprehensive metabolic panel, Lipid panel, Alogliptin-Pioglitazone (OSENI) 25-30 MG TABS  Hypertension associated with diabetes - Plan: CBC with Differential, Comprehensive metabolic panel, olmesartan-hydrochlorothiazide (BENICAR HCT) 40-12.5 MG per tablet, carvedilol (COREG) 6.25 MG tablet, amLODipine (NORVASC) 10 MG tablet  Personal history of noncompliance with medical treatment, presenting hazards to health  Hyperlipidemia associated with type 2 diabetes mellitus - Plan: Lipid panel  Need for prophylactic vaccination and inoculation against influenza - Plan: Flu Vaccine QUAD 36+ mos IM  Right ankle pain        Plan:    1.  Rx changes: none 2.  Education: Reviewed 'ABCs' of diabetes management (respective goals in parentheses):  A1C (<7), blood pressure (<130/80), and cholesterol (LDL <100). 3.  Compliance at present is estimated  to be poor. Efforts to improve compliance (if necessary) will be directed at regular blood sugar monitoring: daily. 4. Follow up: 4 months   I had a long discussion with her concerning taking better care of herself and putting her once needs and desires at least on equal round with other people to encouraged her to continue with her physical activities. Her sister is helping with her diet. Recommend conservative care for the foot with heat and anti-inflammatory and return here symptoms continue. Her  meds will be renewed but will hold the diabetes med for only 6 months. She is to see her eye doctor. Encouraged her to call me if she has any difficulty with her medications

## 2014-08-05 ENCOUNTER — Ambulatory Visit (INDEPENDENT_AMBULATORY_CARE_PROVIDER_SITE_OTHER): Payer: 59

## 2014-08-05 ENCOUNTER — Encounter: Payer: Self-pay | Admitting: Podiatry

## 2014-08-05 ENCOUNTER — Ambulatory Visit (INDEPENDENT_AMBULATORY_CARE_PROVIDER_SITE_OTHER): Payer: 59 | Admitting: Podiatry

## 2014-08-05 VITALS — BP 141/80 | HR 85 | Resp 16

## 2014-08-05 DIAGNOSIS — M79671 Pain in right foot: Secondary | ICD-10-CM | POA: Diagnosis not present

## 2014-08-05 DIAGNOSIS — M722 Plantar fascial fibromatosis: Secondary | ICD-10-CM | POA: Diagnosis not present

## 2014-08-05 DIAGNOSIS — M779 Enthesopathy, unspecified: Secondary | ICD-10-CM | POA: Diagnosis not present

## 2014-08-05 DIAGNOSIS — M7751 Other enthesopathy of right foot: Secondary | ICD-10-CM

## 2014-08-05 DIAGNOSIS — M778 Other enthesopathies, not elsewhere classified: Secondary | ICD-10-CM

## 2014-08-05 MED ORDER — MELOXICAM 15 MG PO TABS: 15.0000 mg | ORAL_TABLET | Freq: Every day | ORAL | Status: DC

## 2014-08-05 NOTE — Progress Notes (Signed)
   Subjective:    Patient ID: Aimee Sharp, female    DOB: 1955-11-12, 59 y.o.   MRN: [id number]  HPI Comments: "I have pain in this right one"  Patient c/o aching lateral foot and anterior ankle right for several months. The area is swollen. No injury recently. She states 40 years ago she broke the right ankle. She has been using Tylenol cream and ice. No help.  Foot Pain      Review of Systems  All other systems reviewed and are negative.      Objective:   Physical Exam: I have reviewed her past medical history medications allergy surgery social history and review of systems. Pulses are strongly palpable. Neurologic sensorium is intact deep tendon reflexes are intact and muscle strength cannot be determined in the right foot based on the pain that she is experiencing. Orthopedic evaluation and strict pain on palpation medial calcaneal tubercle. She has pain on end range of motion or even slight motion of the subtalar joint. She also has pain on palpation of the posterior tibial tendon the tendo Achilles and the peroneal tendons. No forefoot pain. Radiographic evaluation does not demonstrate any type of osseus abnormalities in this area other than a soft tissue increasing density at the plantar fascial calcaneal insertion site.        Assessment & Plan:  Assessment: Subtalar joint capsulitis and plantar fasciitis right.  Plan: Injected the plantar right heel today with Kenalog and local anesthetic as well as the subtalar joint after a Betadine skin prep. 20 mg of Kenalog were injected into each area with local anesthetic. She tolerated the procedure but was exquisitely painful for her. I will follow-up with her in approximately 2 weeks at which time we may place her in a cam walker she's not doing better. I also made a prescription for meloxicam 15 mg 1 by mouth daily 30.

## 2014-08-12 ENCOUNTER — Ambulatory Visit: Payer: 59 | Admitting: Podiatry

## 2014-08-12 ENCOUNTER — Other Ambulatory Visit: Payer: Self-pay | Admitting: Family Medicine

## 2014-08-19 ENCOUNTER — Encounter: Payer: Self-pay | Admitting: Podiatry

## 2014-08-19 ENCOUNTER — Ambulatory Visit (INDEPENDENT_AMBULATORY_CARE_PROVIDER_SITE_OTHER): Payer: 59 | Admitting: Podiatry

## 2014-08-19 VITALS — BP 139/71 | HR 84 | Resp 16

## 2014-08-19 DIAGNOSIS — M7751 Other enthesopathy of right foot: Secondary | ICD-10-CM

## 2014-08-19 DIAGNOSIS — M779 Enthesopathy, unspecified: Principal | ICD-10-CM

## 2014-08-19 DIAGNOSIS — M722 Plantar fascial fibromatosis: Secondary | ICD-10-CM

## 2014-08-19 DIAGNOSIS — M778 Other enthesopathies, not elsewhere classified: Secondary | ICD-10-CM

## 2014-08-19 NOTE — Progress Notes (Signed)
She presents today for follow-up of capsulitis subtalar joint right as well as plantar fasciitis right. She states that she is doing better than 60% improved and continues the anti-inflammatories. She states that it continues to get better on a daily basis.  Objective: Pulses are palpable right foot. She has pain on palpation and on range of motion of the subtalar joint right foot. She also has pain on palpation plantar fascial calcaneal insertion site.  Assessment: Capsulitis and plantar fasciitis.  Plan: I offered her another set of injections today which she declined. She will like to continue the anti-inflammatories on a regular basis and I will follow-up with her as needed.

## 2014-10-18 ENCOUNTER — Ambulatory Visit (INDEPENDENT_AMBULATORY_CARE_PROVIDER_SITE_OTHER): Payer: 59 | Admitting: Family Medicine

## 2014-10-18 ENCOUNTER — Encounter: Payer: Self-pay | Admitting: Family Medicine

## 2014-10-18 VITALS — BP 172/100 | HR 73 | Wt 170.6 lb

## 2014-10-18 DIAGNOSIS — I10 Essential (primary) hypertension: Secondary | ICD-10-CM

## 2014-10-18 DIAGNOSIS — Z9119 Patient's noncompliance with other medical treatment and regimen: Secondary | ICD-10-CM

## 2014-10-18 DIAGNOSIS — E1169 Type 2 diabetes mellitus with other specified complication: Secondary | ICD-10-CM

## 2014-10-18 DIAGNOSIS — E785 Hyperlipidemia, unspecified: Secondary | ICD-10-CM | POA: Diagnosis not present

## 2014-10-18 DIAGNOSIS — E1159 Type 2 diabetes mellitus with other circulatory complications: Secondary | ICD-10-CM

## 2014-10-18 DIAGNOSIS — E119 Type 2 diabetes mellitus without complications: Secondary | ICD-10-CM

## 2014-10-18 DIAGNOSIS — Z91199 Patient's noncompliance with other medical treatment and regimen due to unspecified reason: Secondary | ICD-10-CM

## 2014-10-18 DIAGNOSIS — I152 Hypertension secondary to endocrine disorders: Secondary | ICD-10-CM

## 2014-10-18 LAB — POCT GLYCOSYLATED HEMOGLOBIN (HGB A1C): Hemoglobin A1C: 6.8

## 2014-10-18 MED ORDER — GLUCOSE BLOOD VI STRP: ORAL_STRIP | Status: DC

## 2014-10-18 MED ORDER — ALOGLIPTIN-PIOGLITAZONE 25-30 MG PO TABS: 1.0000 | ORAL_TABLET | Freq: Every day | ORAL | Status: DC

## 2014-10-18 MED ORDER — ONETOUCH DELICA LANCETS FINE MISC: Status: DC

## 2014-10-18 MED ORDER — CARVEDILOL PHOSPHATE ER 10 MG PO CP24: 10.0000 mg | ORAL_CAPSULE | Freq: Every day | ORAL | Status: DC

## 2014-10-18 MED ORDER — AMLODIPINE BESYLATE 10 MG PO TABS: 10.0000 mg | ORAL_TABLET | Freq: Every day | ORAL | Status: DC

## 2014-10-18 MED ORDER — OLMESARTAN MEDOXOMIL-HCTZ 40-12.5 MG PO TABS: 1.0000 | ORAL_TABLET | Freq: Every day | ORAL | Status: DC

## 2014-10-18 MED ORDER — ATORVASTATIN CALCIUM 20 MG PO TABS: 20.0000 mg | ORAL_TABLET | Freq: Every day | ORAL | Status: DC

## 2014-10-18 NOTE — Patient Instructions (Signed)
Check your blood sugar daily the before a meal or 2 ours after a meal. 20 minutes daily of something physical.

## 2014-10-18 NOTE — Progress Notes (Signed)
   Subjective:    Patient ID: Aimee Sharp, female    DOB: 12/19/1955, 59 y.o.   MRN: [id number]  HPI She is here for a recheck on her diabetes. She did run out of her blood pressure medications 2 days ago. When asked about her diabetes she gave no information. She does not have a glucometer and cannot give me a good reason as to why she does not. She has been to 1 diabetes education session. She is not on any, regular exercise or eating habit regimen. She does not smoke or drink. She does not do regular eye checks or foot exams. She gives no good reason as to why she has not been educated about this since she has had diabetes for several years.   Review of Systems     Objective:   Physical Exam Alert and in no distress. Hemoglobin A1c is 6.8.       Assessment & Plan:  Type 2 diabetes mellitus without complication - Plan: Alogliptin-Pioglitazone (OSENI) 25-30 MG TABS, Amb Referral to Nutrition and Diabetic E, POCT glycosylated hemoglobin (Hb A1C)  Hypertension associated with diabetes - Plan: carvedilol (COREG CR) 10 MG 24 hr capsule, olmesartan-hydrochlorothiazide (BENICAR HCT) 40-12.5 MG per tablet, amLODipine (NORVASC) 10 MG tablet  Hyperlipidemia associated with type 2 diabetes mellitus - Plan: atorvastatin (LIPITOR) 20 MG tablet  Personal history of noncompliance with medical treatment, presenting hazards to health I again stressed the need for her to get better control of her diabetes even know her numbers look good. Discussed the need for foot care eyecare, daily blood sugar checks, diet, exercise, risk for stroke point this heart disease kidney disease. I will set her up again to go to the diabetes education. Strongly encouraged her to take ownership of her diabetes. I explained that I could not take care of her diabetes but could work with her to take care of it.

## 2014-12-16 LAB — HM DIABETES EYE EXAM

## 2015-02-18 ENCOUNTER — Ambulatory Visit (INDEPENDENT_AMBULATORY_CARE_PROVIDER_SITE_OTHER): Payer: 59 | Admitting: Family Medicine

## 2015-02-18 ENCOUNTER — Ambulatory Visit: Payer: 59 | Admitting: Family Medicine

## 2015-02-18 ENCOUNTER — Encounter: Payer: Self-pay | Admitting: Family Medicine

## 2015-02-18 VITALS — BP 140/80 | HR 76 | Ht 63.0 in | Wt 173.0 lb

## 2015-02-18 DIAGNOSIS — E119 Type 2 diabetes mellitus without complications: Secondary | ICD-10-CM | POA: Diagnosis not present

## 2015-02-18 DIAGNOSIS — Z23 Encounter for immunization: Secondary | ICD-10-CM

## 2015-02-18 DIAGNOSIS — E785 Hyperlipidemia, unspecified: Secondary | ICD-10-CM | POA: Diagnosis not present

## 2015-02-18 DIAGNOSIS — Z9119 Patient's noncompliance with other medical treatment and regimen: Secondary | ICD-10-CM

## 2015-02-18 DIAGNOSIS — E1169 Type 2 diabetes mellitus with other specified complication: Secondary | ICD-10-CM

## 2015-02-18 DIAGNOSIS — E1159 Type 2 diabetes mellitus with other circulatory complications: Secondary | ICD-10-CM

## 2015-02-18 DIAGNOSIS — I10 Essential (primary) hypertension: Secondary | ICD-10-CM

## 2015-02-18 DIAGNOSIS — Z91199 Patient's noncompliance with other medical treatment and regimen due to unspecified reason: Secondary | ICD-10-CM

## 2015-02-18 DIAGNOSIS — I152 Hypertension secondary to endocrine disorders: Secondary | ICD-10-CM

## 2015-02-18 LAB — POCT GLYCOSYLATED HEMOGLOBIN (HGB A1C): Hemoglobin A1C: 7.1

## 2015-02-18 MED ORDER — AZILSARTAN-CHLORTHALIDONE 40-25 MG PO TABS: 1.0000 | ORAL_TABLET | Freq: Every day | ORAL | Status: DC

## 2015-02-18 NOTE — Patient Instructions (Addendum)
Stop the  Benicar and amlodipine Check blood sugars 2 hours after meals and see what they are.If they are above 200 then you need to make adjustments. When you are ready to go back to the diabetes and nutrition classes, let me know.

## 2015-02-18 NOTE — Progress Notes (Signed)
  Subjective:    Patient ID: Cecile Hearing, female    DOB: 1955-11-01, 59 y.o.   MRN: [id number]  Kimbrely Buckel is a 59 y.o. female who presents for follow-up of Type 2 diabetes mellitus.  Home blood sugar records: She checks 2 or 3 times per week.She states she eats candy bars to help stay awake at night while she is working. Current symptoms/problems NONE Daily foot checks:no   Any foot concerns: SWELLING.Is usually occurs after she works a 12 hour shift. Exercise: NONE EYE: 12/2014 NORMAL She did go to one diabetes education class but states that work has kept her from going back. She does occasionally check her blood sugars and they are elevated but does not drink anything about him. Review his record indicates she has gained weight over the last several months. The following portions of the patient's history were reviewed and updated as appropriate: allergies, current medications, past medical history, past social history and problem list.  ROS as in subjective above.     Objective:    Physical Exam Alert and in no distress otherwise not examined.   Lab Review Diabetic Labs Latest Ref Rng 10/18/2014 06/17/2014 04/24/2013 02/20/2013 09/26/2012  HbA1c - 6.8% 7.2 - 8.2 7.5%  Chol 0 - 200 mg/dL - 121 - - -  HDL >39 mg/dL - 40 - - -  Calc LDL 0 - 99 mg/dL - 63 - - -  Triglycerides <150 mg/dL - 92 - - -  Creatinine 0.50 - 1.10 mg/dL - 0.74 0.80 - -   BP/Weight 10/18/2014 08/19/2014 08/05/2014 06/17/2014 56/38/9373  Systolic BP 428 768 115 726 203  Diastolic BP 559 71 80 80 66  Wt. (Lbs) 170.6 - - 165 164  BMI 30.69 - - 29.68 29.06   HbA1c 7.7 [place]  reports that she has never smoked. She has never used smokeless tobacco. She reports that she does not drink alcohol or use illicit drugs.     Assessment & Plan:    Type 2 diabetes mellitus without complication - Plan: POCT glycosylated hemoglobin (Hb A1C)  Need for prophylactic vaccination and inoculation against influenza - Plan: Flu  Vaccine QUAD 36+ mos PF IM (Fluarix & Fluzone Quad PF)  Need for prophylactic vaccination with combined diphtheria-tetanus-pertussis (DTP) vaccine - Plan: Tdap vaccine greater than or equal to 7yo IM  Hypertension associated with diabetes - Plan: Azilsartan-Chlorthalidone (EDARBYCLOR) 40-25 MG TABS  Hyperlipidemia associated with type 2 diabetes mellitus  Personal history of noncompliance with medical treatment, presenting hazards to health   1. Rx changes: Edarbychlor 40/25 2. Education: Reviewed 'ABCs' of diabetes management (respective goals in parentheses):  A1C (<7), blood pressure (<130/80), and cholesterol (LDL <100). 3. Compliance at present is estimated to be poor. Efforts to improve compliance (if necessary) will be directed at recommend checking blood sugars 2 hours after meals. 4. Follow up: 1 months I discussed her non compliance in detail with her and strongly encouraged her to take better care of herself Her HTN is not at goal and I will switch to a different med for better control

## 2015-02-25 ENCOUNTER — Ambulatory Visit: Payer: 59 | Admitting: Family Medicine

## 2015-03-15 ENCOUNTER — Encounter: Payer: Self-pay | Admitting: Podiatry

## 2015-03-15 ENCOUNTER — Ambulatory Visit (INDEPENDENT_AMBULATORY_CARE_PROVIDER_SITE_OTHER): Payer: 59 | Admitting: Podiatry

## 2015-03-15 VITALS — BP 150/91 | HR 91 | Resp 16

## 2015-03-15 DIAGNOSIS — M722 Plantar fascial fibromatosis: Secondary | ICD-10-CM | POA: Diagnosis not present

## 2015-03-15 DIAGNOSIS — M7751 Other enthesopathy of right foot: Secondary | ICD-10-CM

## 2015-03-15 DIAGNOSIS — M778 Other enthesopathies, not elsewhere classified: Secondary | ICD-10-CM

## 2015-03-15 DIAGNOSIS — M779 Enthesopathy, unspecified: Principal | ICD-10-CM

## 2015-03-15 DIAGNOSIS — G5781 Other specified mononeuropathies of right lower limb: Secondary | ICD-10-CM

## 2015-03-15 DIAGNOSIS — G5761 Lesion of plantar nerve, right lower limb: Secondary | ICD-10-CM | POA: Diagnosis not present

## 2015-03-15 MED ORDER — METHYLPREDNISOLONE 4 MG PO TBPK
ORAL_TABLET | ORAL | Status: DC
Start: 2015-03-15 — End: 2015-03-25

## 2015-03-16 NOTE — Progress Notes (Signed)
She presents today for follow-up of her pain to her right foot. She states that the subtalar joint feels better however she is developing pain along the plantar medial aspect of the right foot as well as numbness in the third and fourth digits of the right foot.  Objective: Vital signs are stable alert and oriented 3. Pulses are strongly palpable. Neurologic sensorium is intact for Semmes-Weinstein monofilament. Deep tendon reflexes are intact. Muscle strength is 5 over 5 dorsiflexion plantar flexors and inverters everters all to the musculature is intact. Orthopedic evaluation of his wrist pain on palpation medial calcaneal tubercle of the right heel. Some tenderness on palpation to the third interdigital space of the right foot.  Assessment: Plantar fasciitis right heel capsulitis subtalar joint right. Neuroma third interspace right foot.  Plan: We injected the right heel today with Kenalog and local anesthetic also suggested appropriate shoe gear stretching exercises and ice therapy. Started her on a Medrol Dosepak and will follow-up with her in 1 month. She will watch her blood sugars carefully and adjust her medications accordingly.  Aimee Sharp DPM

## 2015-03-25 ENCOUNTER — Ambulatory Visit (INDEPENDENT_AMBULATORY_CARE_PROVIDER_SITE_OTHER): Payer: 59 | Admitting: Family Medicine

## 2015-03-25 ENCOUNTER — Encounter: Payer: Self-pay | Admitting: Family Medicine

## 2015-03-25 VITALS — BP 122/76 | HR 80 | Ht 63.0 in | Wt 173.0 lb

## 2015-03-25 DIAGNOSIS — E1159 Type 2 diabetes mellitus with other circulatory complications: Secondary | ICD-10-CM | POA: Diagnosis not present

## 2015-03-25 DIAGNOSIS — Z23 Encounter for immunization: Secondary | ICD-10-CM | POA: Diagnosis not present

## 2015-03-25 DIAGNOSIS — I10 Essential (primary) hypertension: Secondary | ICD-10-CM

## 2015-03-25 DIAGNOSIS — I152 Hypertension secondary to endocrine disorders: Secondary | ICD-10-CM

## 2015-03-25 NOTE — Progress Notes (Signed)
   Subjective:    Patient ID: Aimee Sharp, female    DOB: [birth year], 59 y.o.   MRN: [id number]  HPI She is here for recheck on her blood pressure. She is now taking Edarbychlor. She is having no difficulty with that medication.   Review of Systems     Objective:   Physical Exam Alert and in no distress. Blood pressure is recorded.       Assessment & Plan:  Need for prophylactic vaccination against Streptococcus pneumoniae (pneumococcus) - Plan: Pneumococcal polysaccharide vaccine 23-valent greater than or equal to 2yo subcutaneous/IM  Hypertension associated with diabetes ([place])  Stay on thef present medication. Instructed her to make an appointment after she turns 60

## 2015-04-19 ENCOUNTER — Ambulatory Visit (INDEPENDENT_AMBULATORY_CARE_PROVIDER_SITE_OTHER): Payer: 59 | Admitting: Podiatry

## 2015-04-19 ENCOUNTER — Encounter: Payer: Self-pay | Admitting: Podiatry

## 2015-04-19 VITALS — BP 152/90 | HR 89 | Resp 16

## 2015-04-19 DIAGNOSIS — M722 Plantar fascial fibromatosis: Secondary | ICD-10-CM | POA: Diagnosis not present

## 2015-04-19 MED ORDER — DICLOFENAC SODIUM 75 MG PO TBEC: 75.0000 mg | DELAYED_RELEASE_TABLET | Freq: Two times a day (BID) | ORAL | Status: DC

## 2015-04-19 NOTE — Progress Notes (Signed)
She presents today for follow-up of her plantar fasciitis. She states it is doing much better but is not well yet. She continues her plantar fascial braces and her oral anti-inflammatory therapy.  Objective: Vital signs are stable alert and oriented 3. Pulses are strongly palpable. Neurologic sensorium is intact. She has pain on palpation medial calcaneal tubercle.  Assessment: Plantar fasciitis resolving.  Plan: Reinjected the foot today discussed etiology pathology conservative versus surgical therapies. If this does not resolve it then we will need to consider shockwave therapy or physical therapy.

## 2015-07-29 ENCOUNTER — Other Ambulatory Visit: Payer: 59

## 2015-08-12 ENCOUNTER — Other Ambulatory Visit (INDEPENDENT_AMBULATORY_CARE_PROVIDER_SITE_OTHER): Payer: 59

## 2015-08-12 DIAGNOSIS — Z23 Encounter for immunization: Secondary | ICD-10-CM | POA: Diagnosis not present

## 2015-08-14 ENCOUNTER — Ambulatory Visit (INDEPENDENT_AMBULATORY_CARE_PROVIDER_SITE_OTHER): Payer: 59 | Admitting: Family Medicine

## 2015-08-14 VITALS — BP 114/76 | HR 97 | Temp 98.2°F | Resp 18 | Wt 166.0 lb

## 2015-08-14 DIAGNOSIS — T8090XA Unspecified complication following infusion and therapeutic injection, initial encounter: Secondary | ICD-10-CM | POA: Diagnosis not present

## 2015-08-14 DIAGNOSIS — L03114 Cellulitis of left upper limb: Secondary | ICD-10-CM

## 2015-08-14 LAB — GLUCOSE, POCT (MANUAL RESULT ENTRY): POC Glucose: 307 mg/dl — AB (ref 70–99)

## 2015-08-14 LAB — POCT CBC
Granulocyte percent: 58 %G (ref 37–80)
HEMATOCRIT: 33.3 % — AB (ref 37.7–47.9)
HEMOGLOBIN: 11.6 g/dL — AB (ref 12.2–16.2)
LYMPH, POC: 3 (ref 0.6–3.4)
MCH, POC: 30.2 pg (ref 27–31.2)
MCHC: 34.8 g/dL (ref 31.8–35.4)
MCV: 86.7 fL (ref 80–97)
MID (cbc): 0.3 (ref 0–0.9)
MPV: 7.7 fL (ref 0–99.8)
PLATELET COUNT, POC: 300 10*3/uL (ref 142–424)
POC GRANULOCYTE: 4.5 (ref 2–6.9)
POC LYMPH PERCENT: 38.4 %L (ref 10–50)
POC MID %: 3.6 %M (ref 0–12)
RBC: 3.83 M/uL — AB (ref 4.04–5.48)
RDW, POC: 13.5 %
WBC: 7.8 10*3/uL (ref 4.6–10.2)

## 2015-08-14 MED ORDER — IBUPROFEN 800 MG PO TABS: 800.0000 mg | ORAL_TABLET | Freq: Three times a day (TID) | ORAL | Status: DC | PRN

## 2015-08-14 MED ORDER — CEPHALEXIN 500 MG PO CAPS: 500.0000 mg | ORAL_CAPSULE | Freq: Four times a day (QID) | ORAL | Status: DC

## 2015-08-14 NOTE — Progress Notes (Signed)
Subjective:    Patient ID: Aimee Sharp, female    DOB: [birth year], 60 y.o.   MRN: [id number] Chief Complaint  Patient presents with  . Medication Reaction    possible reaction to injection site on left arm    HPI  Pt had a shingles vaccine done on [facility] Friday afternoon.  Since then she has had progressive pain, redness, warmth, swelling, and itching at the injection site which has continued to spread. Injection was into the soft adipose tissue at her left tricep. She has not been checking her sugars.  Past Medical History  Diagnosis Date  . Hypertension   . Diabetes mellitus   . Wears glasses     read  . Wears partial dentures     upper partial   Past Surgical History  Procedure Laterality Date  . No surgeries to date    . Wrist ganglion excision  1989    right  . Tubal ligation    . Wisdom tooth extraction    . Orif ankle fracture      right in college  . Colonoscopy    . Mass excision Left 04/29/2013    Procedure: LEFT THUMB DEEP MASS EXCISION;  Surgeon: Linna Hoff, MD;  Location: [facility];  Service: Orthopedics;  Laterality: Left;   Current Outpatient Prescriptions on File Prior to Visit  Medication Sig Dispense Refill  . Alogliptin-Pioglitazone (OSENI) 25-30 MG TABS Take 1 tablet by mouth daily. 90 tablet 1  . Azilsartan-Chlorthalidone (EDARBYCLOR) 40-25 MG TABS Take 1 tablet by mouth daily. 30 tablet 11  . carvedilol (COREG CR) 10 MG 24 hr capsule Take 1 capsule (10 mg total) by mouth daily. 180 capsule 3  . glucose blood test strip Test once a day. Pt uses a onetouch verio meter 100 each 1  . ONETOUCH DELICA LANCETS FINE MISC Test once a day. Lancet needs to be 30g for a one touch verio meter 100 each 1  . atorvastatin (LIPITOR) 20 MG tablet Take 1 tablet (20 mg total) by mouth daily. (Patient not taking: Reported on 08/14/2015) 90 tablet 3  . diclofenac (VOLTAREN) 75 MG EC tablet Take 1 tablet (75 mg total) by  mouth 2 (two) times daily. (Patient not taking: Reported on 08/14/2015) 60 tablet 3   No current facility-administered medications on file prior to visit.   No Known Allergies Family History  Problem Relation Age of Onset  . Cancer Mother    Social History   Social History  . Marital Status: Single    Spouse Name: N/A  . Number of Children: N/A  . Years of Education: N/A   Social History Main Topics  . Smoking status: Never Smoker   . Smokeless tobacco: Never Used  . Alcohol Use: No  . Drug Use: No  . Sexual Activity: Not Asked   Other Topics Concern  . None   Social History Narrative     Review of Systems  Constitutional: Negative for fever, chills, diaphoresis, activity change and appetite change.  HENT: Negative for congestion, drooling, trouble swallowing and voice change.   Eyes: Positive for discharge. Negative for redness and itching.  Respiratory: Negative for cough, chest tightness and shortness of breath.   Cardiovascular: Negative for chest pain, palpitations and leg swelling.  Gastrointestinal: Negative for nausea, vomiting and abdominal pain.  Musculoskeletal: Positive for myalgias. Negative for joint swelling, arthralgias, neck pain and neck stiffness.  Skin: Positive for color change and rash.  Negative for pallor and wound.  Allergic/Immunologic: Negative for immunocompromised state.  Neurological: Negative for dizziness, syncope, weakness and light-headedness.  Hematological: Negative for adenopathy. Does not bruise/bleed easily.  Psychiatric/Behavioral: Negative for sleep disturbance.       Objective:  BP 114/76 mmHg  Pulse 97  Temp(Src) 98.2 F (36.8 C) (Oral)  Resp 18  Wt 166 lb (75.297 kg)  SpO2 98%  LMP  (Exact Date)  Physical Exam  Constitutional: She is oriented to person, place, and time. She appears well-developed and well-nourished. No distress.  HENT:  Head: Normocephalic and atraumatic.  Right Ear: External ear normal.  Eyes:  Conjunctivae are normal. No scleral icterus.  Pulmonary/Chest: Effort normal.  Musculoskeletal:       Left shoulder: She exhibits normal range of motion, no bony tenderness, no spasm and normal strength.       Left elbow: She exhibits normal range of motion and no swelling. No tenderness found.       Left upper arm: She exhibits tenderness and edema. She exhibits no bony tenderness.  Lymphadenopathy:    She has no cervical adenopathy.    She has no axillary adenopathy.       Left axillary: No pectoral adenopathy present.      Left: No supraclavicular and no epitrochlear adenopathy present.  Neurological: She is alert and oriented to person, place, and time.  Skin: Skin is warm and dry. She is not diaphoretic. There is erythema.     6 inches in length by 4 in width erythematous raised plaque along inner upper right arm - over tricep area. Does not extend to elbow axilla and no axillary adenopathy. Warm, tender, indurated, no fluctuance  Psychiatric: She has a normal mood and affect. Her behavior is normal.      Results for orders placed or performed in visit on 08/14/15  POCT CBC  Result Value Ref Range   WBC 7.8 4.6 - 10.2 K/uL   Lymph, poc 3.0 0.6 - 3.4   POC LYMPH PERCENT 38.4 10 - 50 %L   MID (cbc) 0.3 0 - 0.9   POC MID % 3.6 0 - 12 %M   POC Granulocyte 4.5 2 - 6.9   Granulocyte percent 58.0 37 - 80 %G   RBC 3.83 (A) 4.04 - 5.48 M/uL   Hemoglobin 11.6 (A) 12.2 - 16.2 g/dL   HCT, POC 33.3 (A) 37.7 - 47.9 %   MCV 86.7 80 - 97 fL   MCH, POC 30.2 27 - 31.2 pg   MCHC 34.8 31.8 - 35.4 g/dL   RDW, POC 13.5 %   Platelet Count, POC 300 142 - 424 K/uL   MPV 7.7 0 - 99.8 fL  POCT glucose (manual entry)  Result Value Ref Range   POC Glucose 307 (A) 70 - 99 mg/dl    Assessment & Plan:   1. Injection site reaction, initial encounter   2. Cellulitis of left upper extremity   This may just be an inflammatory type reaction to the zostavax without infection as nml cbc reassuring but  as had been rapidly progressive and so warm and tender we will cover her with Keflex. Indurated area outlined - recheck in 2d, sooner if worse. To ER if any lymph spread. Ice, nsaids, no activity restrctions. cbg elevated due to acute inflammatory state so ensure healthy food choices in small amount frequently.  Orders Placed This Encounter  Procedures  . Sedimentation Rate  . C-reactive protein  . POCT CBC  .  POCT glucose (manual entry)    Meds ordered this encounter  Medications  . ibuprofen (ADVIL,MOTRIN) 800 MG tablet    Sig: Take 1 tablet (800 mg total) by mouth every 8 (eight) hours as needed.    Dispense:  30 tablet    Refill:  0  . cephALEXin (KEFLEX) 500 MG capsule    Sig: Take 1 capsule (500 mg total) by mouth 4 (four) times daily.    Dispense:  28 capsule    Refill:  0    Delman Cheadle, MD MPH

## 2015-08-14 NOTE — Patient Instructions (Signed)
Post-Injection Inflammatory Reaction An inflammatory reaction is possible any time a needle is used to give an injection. It is called a post-injection inflammatory reaction because it happens after the needle is put through the skin. A reaction may start minutes after the injection was given, or the reaction may appear several hours after the injection was given. A reaction can last for several hours to several days. CAUSES  An injection reaction can be caused by different things. Possible causes include:  A reaction to the medicine or vaccine that was given.  An infection that occurs if germs get inside the body at the injection site. SYMPTOMS   Some symptoms may be found only at the injection site (localized reaction). These symptoms may include:  Itching.  Redness.  Warmth.  Swelling.  Tenderness.  Pain.  Some symptoms may show up in other parts of the body (systemic reaction). These symptoms may include:  Fever or chills.  Muscle aches.  Nausea.  Headache.  Dizziness. DIAGNOSIS  To determine if there is a post-injection inflammatory reaction, your caregiver may:  Do a physical exam.  Draw a circle around any redness near the injection site. This will help to show whether the redness is spreading. TREATMENT  Treatment will depend on what caused the reaction. Treatment will also vary based on how severe your reaction is. Common treatment methods include:  Putting an ice pack over the injection site.  Taking anti-inflammatory medicine, to reduce swelling and itching.  Taking an antibiotic.  Taking pain medicine. HOME CARE INSTRUCTIONS   Follow all your caregiver's instructions carefully.  Keep the injection site clean.  You may put ice on the injection site.  Put ice in a plastic bag.  Place a towel between your skin and the bag.  Leave the ice on for 15-20 minutes, 03-04 times a day.  If the reaction is in a joint, you might need to rest the joint  for a while. Ask your caregiver how active you can be.  Only take over-the-counter or prescription medicines for pain, fever, or discomfort as directed by your caregiver. Do not give aspirin to children. SEEK MEDICAL CARE IF:   You have any questions about your medicines.  Your pain, redness, warmth, swelling, or itching lasts for several hours.  You have a fever, chills, or muscle aches. SEEK IMMEDIATE MEDICAL CARE IF:  Your pain, swelling, itching, or redness gets worse.  You have trouble breathing.  Your child has a high-pitched cry or does not stop crying.   This information is not intended to replace advice given to you by your health care provider. Make sure you discuss any questions you have with your health care provider.   Document Released: 01/31/2011 Document Revised: 08/13/2011 Document Reviewed: 12/01/2014 Elsevier Interactive Patient Education [employer].

## 2015-08-15 LAB — SEDIMENTATION RATE: SED RATE: 38 mm/h — AB (ref 0–30)

## 2015-08-15 LAB — C-REACTIVE PROTEIN: CRP: 2.3 mg/dL — AB (ref <0.60)

## 2015-08-16 ENCOUNTER — Encounter: Payer: Self-pay | Admitting: Family Medicine

## 2015-08-16 ENCOUNTER — Ambulatory Visit (INDEPENDENT_AMBULATORY_CARE_PROVIDER_SITE_OTHER): Payer: 59 | Admitting: Family Medicine

## 2015-08-16 VITALS — BP 110/70 | HR 84 | Ht 63.0 in | Wt 171.0 lb

## 2015-08-16 DIAGNOSIS — L03114 Cellulitis of left upper limb: Secondary | ICD-10-CM

## 2015-08-16 DIAGNOSIS — T8090XA Unspecified complication following infusion and therapeutic injection, initial encounter: Secondary | ICD-10-CM | POA: Diagnosis not present

## 2015-08-16 NOTE — Progress Notes (Signed)
   Subjective:    Patient ID: Aimee Sharp, female    DOB: 03/03/1956, 60 y.o.   MRN: [id number]  HPI She was given a shingles vaccine last Friday and developed a reaction to that and was seen in urgent care center. She was placed on pain medication as well as an antibiotic. The area was demarcated for future reference. She states that she is now feeling much better and having only itching sensation present in that area.   Review of Systems     Objective:   Physical Exam  Exam of the left arm posteriorly does showslight induration and warmth but approximately one half the size of the original based on the ink markings.       Assessment & Plan:  Injection site reaction, initial encounter  Cellulitis of left upper extremity the cellulitis is resolving. Further discussion indicates that shot was not given an exactly in that area so the sialitis although present I doubt was really associated with the injection since a slight was not in that area. She is to continue on antibiotic until it is gone completely.

## 2016-03-15 ENCOUNTER — Emergency Department (HOSPITAL_COMMUNITY)
Admission: EM | Admit: 2016-03-15 | Discharge: 2016-03-15 | Disposition: A | Discharge status: Home / Self Care | Payer: 59 | Attending: Emergency Medicine | Admitting: Emergency Medicine

## 2016-03-15 ENCOUNTER — Encounter (HOSPITAL_COMMUNITY): Payer: Self-pay | Admitting: Emergency Medicine

## 2016-03-15 ENCOUNTER — Telehealth: Payer: Self-pay | Admitting: Family Medicine

## 2016-03-15 DIAGNOSIS — Z7984 Long term (current) use of oral hypoglycemic drugs: Secondary | ICD-10-CM | POA: Diagnosis not present

## 2016-03-15 DIAGNOSIS — I10 Essential (primary) hypertension: Secondary | ICD-10-CM | POA: Diagnosis not present

## 2016-03-15 DIAGNOSIS — N390 Urinary tract infection, site not specified: Secondary | ICD-10-CM | POA: Diagnosis not present

## 2016-03-15 DIAGNOSIS — E1165 Type 2 diabetes mellitus with hyperglycemia: Secondary | ICD-10-CM | POA: Diagnosis present

## 2016-03-15 DIAGNOSIS — R739 Hyperglycemia, unspecified: Secondary | ICD-10-CM

## 2016-03-15 DIAGNOSIS — E119 Type 2 diabetes mellitus without complications: Secondary | ICD-10-CM

## 2016-03-15 LAB — CBG MONITORING, ED
GLUCOSE-CAPILLARY: 387 mg/dL — AB (ref 65–99)
Glucose-Capillary: 594 mg/dL (ref 65–99)
Glucose-Capillary: 485 mg/dL — ABNORMAL HIGH (ref 65–99)
Glucose-Capillary: 377 mg/dL — ABNORMAL HIGH (ref 65–99)

## 2016-03-15 LAB — CBC
HEMATOCRIT: 37.2 % (ref 36.0–46.0)
Hemoglobin: 12.5 g/dL (ref 12.0–15.0)
MCH: 29.3 pg (ref 26.0–34.0)
MCHC: 33.6 g/dL (ref 30.0–36.0)
MCV: 87.3 fL (ref 78.0–100.0)
Platelets: 330 10*3/uL (ref 150–400)
RBC: 4.26 MIL/uL (ref 3.87–5.11)
RDW: 12.4 % (ref 11.5–15.5)
WBC: 14.5 10*3/uL — AB (ref 4.0–10.5)

## 2016-03-15 LAB — URINALYSIS, ROUTINE W REFLEX MICROSCOPIC
BILIRUBIN URINE: NEGATIVE
Ketones, ur: NEGATIVE mg/dL
Nitrite: NEGATIVE
PROTEIN: NEGATIVE mg/dL
SPECIFIC GRAVITY, URINE: 1.026 (ref 1.005–1.030)
pH: 5.5 (ref 5.0–8.0)

## 2016-03-15 LAB — BASIC METABOLIC PANEL
Anion gap: 14 (ref 5–15)
BUN: 25 mg/dL — AB (ref 6–20)
CHLORIDE: 84 mmol/L — AB (ref 101–111)
CO2: 30 mmol/L (ref 22–32)
CREATININE: 1.5 mg/dL — AB (ref 0.44–1.00)
Calcium: 10 mg/dL (ref 8.9–10.3)
GFR calc Af Amer: 43 mL/min — ABNORMAL LOW (ref >60)
GFR calc non Af Amer: 37 mL/min — ABNORMAL LOW (ref >60)
GLUCOSE: 736 mg/dL — AB (ref 65–99)
POTASSIUM: 4 mmol/L (ref 3.5–5.1)
SODIUM: 128 mmol/L — AB (ref 135–145)

## 2016-03-15 LAB — URINE MICROSCOPIC-ADD ON

## 2016-03-15 MED ORDER — ALOGLIPTIN-PIOGLITAZONE 25-30 MG PO TABS: 1.0000 | ORAL_TABLET | Freq: Every day | ORAL | 1 refills | Status: DC

## 2016-03-15 MED ORDER — SODIUM CHLORIDE 0.9 % IV BOLUS (SEPSIS)
1000.0000 mL | Freq: Once | INTRAVENOUS | Status: AC
  Administered 2016-03-15: 1000 mL via INTRAVENOUS

## 2016-03-15 MED ORDER — DEXTROSE 5 % IV SOLN
1.0000 g | Freq: Once | INTRAVENOUS | Status: AC
  Administered 2016-03-15: 1 g via INTRAVENOUS
  Filled 2016-03-15: qty 10

## 2016-03-15 MED ORDER — INSULIN ASPART 100 UNIT/ML ~~LOC~~ SOLN
10.0000 [IU] | Freq: Once | SUBCUTANEOUS | Status: AC
  Administered 2016-03-15: 10 [IU] via INTRAVENOUS
  Filled 2016-03-15: qty 1

## 2016-03-15 MED ORDER — CEPHALEXIN 500 MG PO CAPS: 500.0000 mg | ORAL_CAPSULE | Freq: Four times a day (QID) | ORAL | 0 refills | Status: DC

## 2016-03-15 NOTE — ED Provider Notes (Signed)
[facility] DEPT Provider Note   CSN: [id number] Arrival date & time: 03/15/16  1504     History   Chief Complaint Chief Complaint  Patient presents with  . Hyperglycemia    HPI Aimee Sharp is a 60 y.o. female.  HPI  The patient is a 60 year old female, she has a known history of diabetes hypertension and hypercholesterolemia however she reports that over the last 6 months though she is not clear with her exact dates, she was taken off of all of her medications except for 1 blood pressure medication. She has done well until approximately 2 weeks ago when she started to have some general haziness to her vision followed by fatigue, dry mouth, frequent urination, frequent thirst and some weight loss as well. The patient states the symptoms are gradually worsening, they're now severe, they're not associated with fever, vomiting, diarrhea, swelling, rash, cough, chest pain, shortness of breath, abdominal pain or any other symptoms. When she called her family doctor's office today she was referred to the emergency department because her blood sugar was very very high.  Past Medical History:  Diagnosis Date  . Diabetes mellitus   . Hypertension   . Wears glasses    read  . Wears partial dentures    upper partial    Patient Active Problem List   Diagnosis Date Noted  . Hyperlipidemia associated with type 2 diabetes mellitus ([place]) 10/12/2011  . Diabetes mellitus ([facility]) 06/18/2011  . Hypertension associated with diabetes ([place] [place]) 06/18/2011    Past Surgical History:  Procedure Laterality Date  . COLONOSCOPY    . MASS EXCISION Left 04/29/2013   Procedure: LEFT THUMB DEEP MASS EXCISION;  Surgeon: Linna Hoff, MD;  Location: [facility];  Service: Orthopedics;  Laterality: Left;  . no surgeries to date    . ORIF ANKLE FRACTURE     right in college  . TUBAL LIGATION    . WISDOM TOOTH EXTRACTION    . WRIST GANGLION EXCISION  1989   right    OB History    No  data available       Home Medications    Prior to Admission medications   Medication Sig Start Date End Date Taking? Authorizing Provider  Azilsartan-Chlorthalidone (EDARBYCLOR) 40-25 MG TABS Take 1 tablet by mouth daily. 02/18/15  Yes Denita Lung, MD  ibuprofen (ADVIL,MOTRIN) 200 MG tablet Take 200 mg by mouth every 6 (six) hours as needed.   Yes Historical Provider, MD  naproxen sodium (ANAPROX) 220 MG tablet Take 220 mg by mouth 2 (two) times daily as needed (PAIN).   Yes Historical Provider, MD  Alogliptin-Pioglitazone (OSENI) 25-30 MG TABS Take 1 tablet by mouth daily. 03/15/16   Noemi Chapel, MD  atorvastatin (LIPITOR) 20 MG tablet Take 1 tablet (20 mg total) by mouth daily. Patient not taking: Reported on 03/15/2016 10/18/14   Denita Lung, MD  carvedilol (COREG CR) 10 MG 24 hr capsule Take 1 capsule (10 mg total) by mouth daily. Patient not taking: Reported on 03/15/2016 10/18/14   Denita Lung, MD  cephALEXin (KEFLEX) 500 MG capsule Take 1 capsule (500 mg total) by mouth 4 (four) times daily. 03/15/16   Noemi Chapel, MD  glucose blood test strip Test once a day. Pt uses a onetouch verio meter Patient not taking: Reported on 03/15/2016 10/18/14   Denita Lung, MD  ibuprofen (ADVIL,MOTRIN) 800 MG tablet Take 1 tablet (800 mg total) by mouth every 8 (eight) hours as  needed. Patient not taking: Reported on 03/15/2016 08/14/15   Shawnee Knapp, MD  [facility] DELICA LANCETS FINE MISC Test once a day. Lancet needs to be 30g for a one touch verio meter 10/18/14   Denita Lung, MD    Family History Family History  Problem Relation Age of Onset  . Cancer Mother     Social History Social History  Substance Use Topics  . Smoking status: Never Smoker  . Smokeless tobacco: Never Used  . Alcohol use No     Allergies   Review of patient's allergies indicates no known allergies.   Review of Systems Review of Systems  All other systems reviewed and are negative.    Physical  Exam Updated Vital Signs BP 120/88   Pulse 107   Temp 98 F (36.7 C) (Oral)   Resp 17   Wt 171 lb (77.6 kg)   LMP  (Exact Date)   SpO2 100%   BMI 30.29 kg/m   Physical Exam  Constitutional: She appears well-developed and well-nourished. No distress.  HENT:  Head: Normocephalic and atraumatic.  Mouth/Throat: No oropharyngeal exudate.  Dry mucous membranes  Eyes: Conjunctivae and EOM are normal. Pupils are equal, round, and reactive to light. Right eye exhibits no discharge. Left eye exhibits no discharge. No scleral icterus.  Neck: Normal range of motion. Neck supple. No JVD present. No thyromegaly present.  Cardiovascular: Regular rhythm, normal heart sounds and intact distal pulses.  Exam reveals no gallop and no friction rub.   No murmur heard. Tachycardic  Pulmonary/Chest: Effort normal and breath sounds normal. No respiratory distress. She has no wheezes. She has no rales.  Abdominal: Soft. Bowel sounds are normal. She exhibits no distension and no mass. There is no tenderness.  Nontender abdomen, no distention  Musculoskeletal: Normal range of motion. She exhibits no edema or tenderness.  Lymphadenopathy:    She has no cervical adenopathy.  Neurological: She is alert. Coordination normal.  Skin: Skin is warm and dry. No rash noted. No erythema.  Psychiatric: She has a normal mood and affect. Her behavior is normal.  Nursing note and vitals reviewed.    ED Treatments / Results  Labs (all labs ordered are listed, but only abnormal results are displayed) Labs Reviewed  BASIC METABOLIC PANEL - Abnormal; Notable for the following:       Result Value   Sodium 128 (*)    Chloride 84 (*)    Glucose, Bld 736 (*)    BUN 25 (*)    Creatinine, Ser 1.50 (*)    GFR calc non Af Amer 37 (*)    GFR calc Af Amer 43 (*)    All other components within normal limits  CBC - Abnormal; Notable for the following:    WBC 14.5 (*)    All other components within normal limits    URINALYSIS, ROUTINE W REFLEX MICROSCOPIC (NOT AT [facility]) - Abnormal; Notable for the following:    APPearance CLOUDY (*)    Glucose, UA >1000 (*)    Hgb urine dipstick TRACE (*)    Leukocytes, UA MODERATE (*)    All other components within normal limits  URINE MICROSCOPIC-ADD ON - Abnormal; Notable for the following:    Squamous Epithelial / LPF 0-5 (*)    Bacteria, UA MANY (*)    All other components within normal limits  CBG MONITORING, ED - Abnormal; Notable for the following:    Glucose-Capillary >600 (*)    All other components  within normal limits  CBG MONITORING, ED - Abnormal; Notable for the following:    Glucose-Capillary 594 (*)    All other components within normal limits  CBG MONITORING, ED - Abnormal; Notable for the following:    Glucose-Capillary 485 (*)    All other components within normal limits  CBG MONITORING, ED - Abnormal; Notable for the following:    Glucose-Capillary 387 (*)    All other components within normal limits  URINE CULTURE     Radiology No results found.  Procedures Procedures (including critical care time)  Medications Ordered in ED Medications  insulin aspart (novoLOG) injection 10 Units (10 Units Intravenous Given 03/15/16 1756)  sodium chloride 0.9 % bolus 1,000 mL (0 mLs Intravenous Stopped 03/15/16 1951)  sodium chloride 0.9 % bolus 1,000 mL (0 mLs Intravenous Stopped 03/15/16 2049)  cefTRIAXone (ROCEPHIN) 1 g in dextrose 5 % 50 mL IVPB (0 g Intravenous Stopped 03/15/16 1941)     Initial Impression / Assessment and Plan / ED Course  I have reviewed the triage vital signs and the nursing notes.  Pertinent labs & imaging results that were available during my care of the patient were reviewed by me and considered in my medical decision making (see chart for details).  Clinical Course   The patient has abnormal labs including a hyponatremia with a hyperglycemia though there is no significant anion gap acidosis, there is no  significant decrease in her CO2 and there is no significant potassium abnormalities. She does have a leukocytosis which could be from several different reasons including dehydration and the hyperglycemia though this could also be related to urinary tract infection. I suspected the hyperglycemia is secondary to not using the medications are not to some other source however labs are ultimately pending at this time. She is mildly tachycardic, there is no fever or significant abnormalities with her blood pressure. She will get IV fluids. Repeat evaluation. Insulin. Recheck and anticipate discharge on her home medications.  Multiple liters of IVF, insulin, improved CBG, well appaering, no DKA, stable for d/c  Vitals:   03/15/16 2115 03/15/16 2209 03/15/16 2215 03/15/16 2245  BP: 116/82 111/69 115/77 120/88  Pulse: 109 99 107 107  Resp: 18  17   Temp:      TempSrc:      SpO2: 100% 100% 100% 100%  Weight:         Final Clinical Impressions(s) / ED Diagnoses   Final diagnoses:  Hyperglycemia  Urinary tract infection without hematuria, site unspecified    New Prescriptions New Prescriptions   CEPHALEXIN (KEFLEX) 500 MG CAPSULE    Take 1 capsule (500 mg total) by mouth 4 (four) times daily.     Noemi Chapel, MD 03/15/16 2308

## 2016-03-15 NOTE — ED Notes (Signed)
Pt ambulatory to restroom with steady gait.

## 2016-03-15 NOTE — ED Notes (Signed)
Patient verbalized understanding of discharge instructions and denies any further needs or questions at this time. VS stable. Patient ambulatory with steady gait, escorted to ED entrance.

## 2016-03-15 NOTE — Discharge Instructions (Signed)
You have a urine infection - please take the antibiotic and start the diabetic medications as well  See your doctor in 1 week!  Return to the ER for severe or worsening symptoms. Please obtain all of your results from medical records or have your doctors office obtain the results - share them with your doctor - you should be seen at your doctors office in the next 2 days. Call today to arrange your follow up. Take the medications as prescribed. Please review all of the medicines and only take them if you do not have an allergy to them. Please be aware that if you are taking birth control pills, taking other prescriptions, ESPECIALLY ANTIBIOTICS may make the birth control ineffective - if this is the case, either do not engage in sexual activity or use alternative methods of birth control such as condoms until you have finished the medicine and your family doctor says it is OK to restart them. If you are on a blood thinner such as COUMADIN, be aware that any other medicine that you take may cause the coumadin to either work too much, or not enough - you should have your coumadin level rechecked in next 7 days if this is the case.  ?  It is also a possibility that you have an allergic reaction to any of the medicines that you have been prescribed - Everybody reacts differently to medications and while MOST people have no trouble with most medicines, you may have a reaction such as nausea, vomiting, rash, swelling, shortness of breath. If this is the case, please stop taking the medicine immediately and contact your physician.  ?  You should return to the ER if you develop severe or worsening symptoms.

## 2016-03-15 NOTE — Telephone Encounter (Signed)
Pt called at 2:30pm stating that her blood sugar levels are so high that they not recording a level on her monitor. Pt is wanting to know if she should come in to our office for an appointment or go to an urgent care. Pt also advised that she has not been taking any diabetes meds, on/y bp meds. Dr Redmond School advised that pt should go to hospital to be evaluated. Pt said she would go.

## 2016-03-15 NOTE — ED Notes (Signed)
Pt is type 2 diabetic, PCP took her of oral diabetic Mx b/c per Pt "she was doing so well". Pt c/o blurry vision and extreme thirst.

## 2016-03-15 NOTE — ED Triage Notes (Signed)
Pt took blood sugar at home and read high. Pt states she "used to take insulin but recently hasn't been taking it because she was diet controlling it and exercise". Pt reports blurry vision 1 week. Pt seems confused but alert and ox4.

## 2016-03-15 NOTE — ED Notes (Signed)
Critical glucose 736 per lab.  Dr. Sabra Heck made aware.

## 2016-03-16 ENCOUNTER — Encounter: Payer: Self-pay | Admitting: Family Medicine

## 2016-03-16 ENCOUNTER — Other Ambulatory Visit: Payer: Self-pay

## 2016-03-16 ENCOUNTER — Ambulatory Visit (INDEPENDENT_AMBULATORY_CARE_PROVIDER_SITE_OTHER): Payer: 59 | Admitting: Family Medicine

## 2016-03-16 VITALS — BP 130/80 | HR 100 | Wt 148.0 lb

## 2016-03-16 DIAGNOSIS — N3000 Acute cystitis without hematuria: Secondary | ICD-10-CM

## 2016-03-16 DIAGNOSIS — E1165 Type 2 diabetes mellitus with hyperglycemia: Secondary | ICD-10-CM

## 2016-03-16 LAB — POCT GLYCOSYLATED HEMOGLOBIN (HGB A1C)

## 2016-03-16 MED ORDER — SITAGLIPTIN PHOS-METFORMIN HCL 50-1000 MG PO TABS: 1.0000 | ORAL_TABLET | Freq: Two times a day (BID) | ORAL | 0 refills | Status: DC

## 2016-03-16 MED ORDER — INSULIN GLARGINE 100 UNIT/ML SOLOSTAR PEN: 10.0000 [IU] | PEN_INJECTOR | Freq: Every day | SUBCUTANEOUS | 0 refills | Status: DC

## 2016-03-16 NOTE — Patient Instructions (Addendum)
Start on the [place] and take it twice per day. Take 10 units of the insulin daily. Check your blood sugar either before a meal or 2 hours after summer your meals through the weekend and call me Monday with your numbers. Continue on the Edarbyclor and the antibiotic

## 2016-03-16 NOTE — Progress Notes (Signed)
   Subjective:    Patient ID: Aimee Sharp, female    DOB: [birth year], 60 y.o.   MRN: [id number]  HPI She is here for a follow-up visit. She was seen in the emergency room yesterday and did have a blood sugar in the 700 range. She has been off all for diabetes medicines for approximately one year. Further discussion with her indicates that she is understood my previous recommendations. I asked her to stop her other blood pressure medications when I placed her on a diuretic or and she thought that I met stopping all medications. She has therefore been off her diabetes medicines for quite some time and then ran into difficulty with polyuria, polydipsia and visual issues. She was given IV fluids. She was noted to have a UTI and given Keflex. They did give her Oseni to start on. She is here for follow-up.   Review of Systems     Objective:   Physical Exam  Alert and in no distress. Blood sugar in the office is over 400. Her A1c is greater than 14.       Assessment & Plan:  Uncontrolled type 2 diabetes mellitus with hyperglycemia, without long-term current use of insulin (HCC) - Plan: HgB A1c, Insulin Glargine (LANTUS SOLOSTAR) 100 UNIT/ML Solostar Pen, Amb Referral to Nutrition and Diabetic E, sitaGLIPtin-metformin (JANUMET) 50-1000 MG tablet  Acute cystitis without hematuria I discussed the miscommunication with her. Discussed the need for her to maintain routine follow-up concerning her diabetes and the fact that we control blood pressure, cholesterol and blood sugar rather than cure. I will start her on Janumet trying keep her cost under control. Samples given of the 50/1000 to be used twice a day. We will also place her on Lantus at 10 units. Explained the fact that we need to bring her blood sugars under control but not rapidly. Discussed the toxic effect of elevated blood sugar. Also refer to diabetes and nutrition for follow-up on this. They will call me Monday with the results of the blood  sugars. Will eventually place her back on her statin. She will watch very closely for the next month or so. Over 40 minutes, the majority of this spent in counseling and coordination of care.

## 2016-03-17 LAB — URINE CULTURE

## 2016-03-18 ENCOUNTER — Other Ambulatory Visit: Payer: Self-pay | Admitting: Family Medicine

## 2016-03-18 DIAGNOSIS — I152 Hypertension secondary to endocrine disorders: Secondary | ICD-10-CM

## 2016-03-18 DIAGNOSIS — I10 Essential (primary) hypertension: Principal | ICD-10-CM

## 2016-03-18 DIAGNOSIS — E1159 Type 2 diabetes mellitus with other circulatory complications: Secondary | ICD-10-CM

## 2016-03-19 ENCOUNTER — Ambulatory Visit (INDEPENDENT_AMBULATORY_CARE_PROVIDER_SITE_OTHER): Payer: 59 | Admitting: Family Medicine

## 2016-03-19 VITALS — BP 124/70 | HR 81

## 2016-03-19 DIAGNOSIS — E785 Hyperlipidemia, unspecified: Secondary | ICD-10-CM

## 2016-03-19 DIAGNOSIS — E1169 Type 2 diabetes mellitus with other specified complication: Secondary | ICD-10-CM

## 2016-03-19 DIAGNOSIS — E1165 Type 2 diabetes mellitus with hyperglycemia: Secondary | ICD-10-CM | POA: Diagnosis not present

## 2016-03-19 MED ORDER — EMPAGLIFLOZIN-LINAGLIPTIN 25-5 MG PO TABS: 1.0000 | ORAL_TABLET | Freq: Every day | ORAL | 5 refills | Status: DC

## 2016-03-19 MED ORDER — ATORVASTATIN CALCIUM 20 MG PO TABS: 20.0000 mg | ORAL_TABLET | Freq: Every day | ORAL | 3 refills | Status: DC

## 2016-03-19 NOTE — Patient Instructions (Signed)
Continue taking the Lantus at night and checking her blood sugars. Start on the new medication and if you have any symptoms that you're concerned about call me. If the sugars go below 100 call me. Throw away the Janumet

## 2016-03-19 NOTE — Progress Notes (Signed)
   Subjective:    Patient ID: Aimee Sharp, female    DOB: [birth year], 60 y.o.   MRN: [id number]  HPI She is here for recheck. Her blood sugars now are averaging under 300. She has had difficulty with diarrhea, headache and dizziness otherwise she has done well.   Review of Systems     Objective:   Physical Exam Alert and in no distress otherwise not examined       Assessment & Plan:  Uncontrolled type 2 diabetes mellitus with hyperglycemia, without long-term current use of insulin (HCC) - Plan: Empagliflozin-Linagliptin (GLYXAMBI) 25-5 MG TABS  Hyperlipidemia associated with type 2 diabetes mellitus ([place]) - Plan: atorvastatin (LIPITOR) 20 MG tablet Since she is intolerant to metformin, I will switch her to a combination medication. If she has any difficulty with getting this filled, she is to call me. Also discussed possible side effects from this medication. She will keep track of her blood sugars. Also start her back on Lipitor. Return here in 2 weeks.

## 2016-03-22 ENCOUNTER — Other Ambulatory Visit: Payer: Self-pay

## 2016-03-22 ENCOUNTER — Telehealth: Payer: Self-pay | Admitting: Family Medicine

## 2016-03-22 MED ORDER — GLUCOSE BLOOD VI STRP: ORAL_STRIP | 4 refills | Status: DC

## 2016-03-22 MED ORDER — ONETOUCH DELICA LANCETS FINE MISC: 4 refills | Status: DC

## 2016-03-22 NOTE — Telephone Encounter (Signed)
Have sent new RX to pharmacy

## 2016-03-22 NOTE — Telephone Encounter (Signed)
Pt called stating that she was given One Touch Verio test strips today when she came by our office but those will not work in her meter, She has a One Touch Ultra II. Pt wants Cheri to call her back to go over her supplies.

## 2016-04-03 ENCOUNTER — Encounter: Payer: Self-pay | Admitting: Family Medicine

## 2016-04-03 ENCOUNTER — Ambulatory Visit (INDEPENDENT_AMBULATORY_CARE_PROVIDER_SITE_OTHER): Payer: 59 | Admitting: Family Medicine

## 2016-04-03 VITALS — BP 120/70 | HR 66 | Wt 146.0 lb

## 2016-04-03 DIAGNOSIS — E1165 Type 2 diabetes mellitus with hyperglycemia: Secondary | ICD-10-CM

## 2016-04-03 NOTE — Progress Notes (Signed)
   Subjective:    Patient ID: Aimee Sharp, female    DOB: 1956-02-05, 60 y.o.   MRN: [id number]  HPI Is here for a recheck. She states that her blood sugars in general a running right around 150. She is still having difficulty with her vision but does have cataract surgery pending. She continues on Lantus insulin 10 units per day.   Review of Systems     Objective:   Physical Exam Alert and in no distress otherwise not examined       Assessment & Plan:  Uncontrolled type 2 diabetes mellitus with hyperglycemia, without long-term current use of insulin ([place]) Since her blood sugars now are much better, I will stop Lantus and monitor the sugars. She is to send me a message on my chart in 2 weeks to let me know how she's doing and reschedule an appointment here in one month.

## 2016-04-03 NOTE — Patient Instructions (Signed)
Stop the insulin. Keep track of your blood sugars. Before meals sugars are on average and your 2 hours after blood sugars reading

## 2016-04-13 ENCOUNTER — Encounter: Payer: Self-pay | Admitting: Family Medicine

## 2016-04-17 ENCOUNTER — Telehealth: Payer: Self-pay | Admitting: Family Medicine

## 2016-04-17 NOTE — Telephone Encounter (Signed)
I have talked with pt and moved her appointment sooner

## 2016-04-17 NOTE — Telephone Encounter (Signed)
Pt requesting to speak to Cheri to get advise and to discuss upcoming cataract surgery. Pt has cataract surgery scheduled and need to be at the eye surgeon at noon on same day of her next appointment with Dr Redmond School. Eye doctor is faxing a  request for a note clearing her for surgery & pt is concerned about being able to make appt with Dr Redmond School and getting to eye doctor on time so she is considering changing Dr Lanice Shirts appt but dont want to do that and get off track with what she need to do.

## 2016-04-30 ENCOUNTER — Encounter: Payer: Self-pay | Admitting: Family Medicine

## 2016-04-30 ENCOUNTER — Ambulatory Visit (INDEPENDENT_AMBULATORY_CARE_PROVIDER_SITE_OTHER): Payer: 59 | Admitting: Family Medicine

## 2016-04-30 VITALS — BP 110/72 | HR 73 | Wt 144.4 lb

## 2016-04-30 DIAGNOSIS — E1165 Type 2 diabetes mellitus with hyperglycemia: Secondary | ICD-10-CM

## 2016-04-30 NOTE — Progress Notes (Signed)
   Subjective:    Patient ID: Aimee Sharp, female    DOB: 04-08-1956, 60 y.o.   MRN: [id number]  HPI He is here for recheck. She has been checking her blood sugars regularly and they run in the low 100-150 range. She has been checking them before and after meals. She also is having eye surgery in the next several days. Her vision has cleared up tremendously since the blood sugars have dropped. She continues on her meds listed in the chart.   Review of Systems     Objective:   Physical Exam Hemoglobin A1c is 11.3       Assessment & Plan:  Uncontrolled type 2 diabetes mellitus with hyperglycemia, without long-term current use of insulin ([place]) I congratulated her on doing a good job of getting her blood sugars under control. Recheck here in several months. She was given a note to her for surgery.

## 2016-05-02 ENCOUNTER — Ambulatory Visit: Payer: 59 | Admitting: Family Medicine

## 2016-07-05 DIAGNOSIS — H40033 Anatomical narrow angle, bilateral: Secondary | ICD-10-CM | POA: Diagnosis not present

## 2016-07-05 DIAGNOSIS — H43393 Other vitreous opacities, bilateral: Secondary | ICD-10-CM | POA: Diagnosis not present

## 2016-07-16 LAB — HM DIABETES EYE EXAM

## 2016-08-27 ENCOUNTER — Telehealth: Payer: Self-pay

## 2016-08-27 ENCOUNTER — Ambulatory Visit: Payer: 59 | Admitting: Family Medicine

## 2016-08-27 NOTE — Telephone Encounter (Signed)
Option 3

## 2016-08-27 NOTE — Telephone Encounter (Signed)
Juliann Pulse I have called and left message per [facility]

## 2016-08-27 NOTE — Telephone Encounter (Signed)
This patient no showed for their appointment today.Which of the following is necessary for this patient.   A) No follow-up necessary   B) Follow-up urgent. Locate Patient Immediately.   C) Follow-up necessary. Contact patient and Schedule visit in ____ Days.   D) Follow-up Advised. Contact patient and Schedule visit in ____ Days.   E) Please send no show letter to patient. Charge no show fee if no show was a CPE.  I have called pt left message she has missed her appointment this morning to please call and make an appointment for her Diabetes.

## 2016-08-27 NOTE — Progress Notes (Deleted)
  Subjective:    Patient ID: Aimee Sharp, female    DOB: 24-Aug-1955, 61 y.o.   MRN: [id number]  Aimee Sharp is a 61 y.o. female who presents for follow-up of Type 2 diabetes mellitus.  Home blood sugar records: {diabetes glucometry results:16657} Current symptoms/problems include {Symptoms; diabetes:14075} and have been {Desc; course:15616}. Daily foot checks:   Any foot concerns: *** Exercise: {types:19826} Diet: The following portions of the patient's history were reviewed and updated as appropriate: allergies, current medications, past medical history, past social history and problem list.  ROS as in subjective above.     Objective:    Physical Exam Alert and in no distress otherwise not examined.  There were no vitals taken for this visit.  Lab Review Diabetic Labs Latest Ref Rng & Units 03/16/2016 03/15/2016 02/18/2015 10/18/2014 06/17/2014  HbA1c - >14.0 - 7.1 6.8% 7.2  Chol 0 - 200 mg/dL - - - - 121  HDL >39 mg/dL - - - - 40  Calc LDL 0 - 99 mg/dL - - - - 63  Triglycerides <150 mg/dL - - - - 92  Creatinine 0.44 - 1.00 mg/dL - 1.50(H) - - 0.74   BP/Weight 04/30/2016 04/03/2016 03/19/2016 03/16/2016 02/72/5366  Systolic BP 440 347 425 956 387  Diastolic BP 72 70 70 80 74  Wt. (Lbs) 144.4 146 - 148 171  BMI 25.58 25.86 - 26.22 30.29   Foot/eye exam completion dates Latest Ref Rng & Units 12/16/2014  Eye Exam No Retinopathy No Retinopathy  Foot Form Completion - -    Mc  reports that she has never smoked. She has never used smokeless tobacco. She reports that she does not drink alcohol or use drugs.     Assessment & Plan:    No diagnosis found.  1. Rx changes: {none:33079} 2. Education: Reviewed 'ABCs' of diabetes management (respective goals in parentheses):  A1C (<7), blood pressure (<130/80), and cholesterol (LDL <100). 3. Compliance at present is estimated to be {good/fair/poor:33178}. Efforts to improve compliance (if necessary) will be directed at  {compliance:16716}. 4. Follow up: {NUMBERS; 0-10:33138} {time:11}

## 2016-08-30 ENCOUNTER — Encounter: Payer: Self-pay | Admitting: Family Medicine

## 2016-08-30 NOTE — Telephone Encounter (Signed)
No show letter sent.

## 2016-09-11 ENCOUNTER — Telehealth: Payer: Self-pay

## 2016-09-11 NOTE — Telephone Encounter (Signed)
Call from Dr. Earlie Lou office asking if pt needs to be pre-medicated for dental procedure. Pt noted on her medical history papers that she has hx of rheumatic fever and heart murmur at the dental office.   Per Dr. Redmond School- he does not have record of this and is not able to advise on pre-medication.Dr. Earlie Lou office advised. Aimee Sharp

## 2016-09-13 ENCOUNTER — Ambulatory Visit (INDEPENDENT_AMBULATORY_CARE_PROVIDER_SITE_OTHER): Payer: 59 | Admitting: Family Medicine

## 2016-09-13 ENCOUNTER — Encounter: Payer: Self-pay | Admitting: Family Medicine

## 2016-09-13 VITALS — BP 130/84 | HR 78 | Ht 63.0 in | Wt 146.0 lb

## 2016-09-13 DIAGNOSIS — E1159 Type 2 diabetes mellitus with other circulatory complications: Secondary | ICD-10-CM

## 2016-09-13 DIAGNOSIS — E119 Type 2 diabetes mellitus without complications: Secondary | ICD-10-CM | POA: Diagnosis not present

## 2016-09-13 DIAGNOSIS — E785 Hyperlipidemia, unspecified: Secondary | ICD-10-CM

## 2016-09-13 DIAGNOSIS — I152 Hypertension secondary to endocrine disorders: Secondary | ICD-10-CM

## 2016-09-13 DIAGNOSIS — Z79899 Other long term (current) drug therapy: Secondary | ICD-10-CM | POA: Diagnosis not present

## 2016-09-13 DIAGNOSIS — E1169 Type 2 diabetes mellitus with other specified complication: Secondary | ICD-10-CM | POA: Diagnosis not present

## 2016-09-13 DIAGNOSIS — I10 Essential (primary) hypertension: Secondary | ICD-10-CM

## 2016-09-13 LAB — COMPREHENSIVE METABOLIC PANEL
ALBUMIN: 3.9 g/dL (ref 3.6–5.1)
ALK PHOS: 124 U/L (ref 33–130)
ALT: 24 U/L (ref 6–29)
AST: 27 U/L (ref 10–35)
BILIRUBIN TOTAL: 0.4 mg/dL (ref 0.2–1.2)
BUN: 27 mg/dL — ABNORMAL HIGH (ref 7–25)
CALCIUM: 10 mg/dL (ref 8.6–10.4)
CO2: 31 mmol/L (ref 20–31)
Chloride: 100 mmol/L (ref 98–110)
Creat: 1.04 mg/dL — ABNORMAL HIGH (ref 0.50–0.99)
GLUCOSE: 115 mg/dL — AB (ref 65–99)
Potassium: 3.3 mmol/L — ABNORMAL LOW (ref 3.5–5.3)
Sodium: 140 mmol/L (ref 135–146)
Total Protein: 7.4 g/dL (ref 6.1–8.1)

## 2016-09-13 LAB — CBC WITH DIFFERENTIAL/PLATELET
BASOS ABS: 0 {cells}/uL (ref 0–200)
BASOS PCT: 0 %
EOS ABS: 160 {cells}/uL (ref 15–500)
Eosinophils Relative: 2 %
HCT: 35.8 % (ref 35.0–45.0)
HEMOGLOBIN: 11.6 g/dL — AB (ref 11.7–15.5)
Lymphocytes Relative: 51 %
Lymphs Abs: 4080 cells/uL — ABNORMAL HIGH (ref 850–3900)
MCH: 27.8 pg (ref 27.0–33.0)
MCHC: 32.4 g/dL (ref 32.0–36.0)
MCV: 85.9 fL (ref 80.0–100.0)
MPV: 10.6 fL (ref 7.5–12.5)
Monocytes Absolute: 400 cells/uL (ref 200–950)
Monocytes Relative: 5 %
NEUTROS ABS: 3360 {cells}/uL (ref 1500–7800)
Neutrophils Relative %: 42 %
Platelets: 257 10*3/uL (ref 140–400)
RBC: 4.17 MIL/uL (ref 3.80–5.10)
RDW: 14.2 % (ref 11.0–15.0)
WBC: 8 10*3/uL (ref 4.0–10.5)

## 2016-09-13 LAB — LIPID PANEL
Cholesterol: 119 mg/dL (ref <200)
HDL: 44 mg/dL — ABNORMAL LOW (ref >50)
LDL Cholesterol: 57 mg/dL (ref <100)
TRIGLYCERIDES: 88 mg/dL (ref <150)
Total CHOL/HDL Ratio: 2.7 Ratio (ref <5.0)
VLDL: 18 mg/dL (ref <30)

## 2016-09-13 LAB — POCT UA - MICROALBUMIN
ALBUMIN/CREATININE RATIO, URINE, POC: 5.1
Creatinine, POC: 100.7 mg/dL
Microalbumin Ur, POC: 5.1 mg/L

## 2016-09-13 LAB — POCT GLYCOSYLATED HEMOGLOBIN (HGB A1C): HEMOGLOBIN A1C: 7.9

## 2016-09-13 NOTE — Patient Instructions (Signed)
Check your blood sugar once per day either before a meal or 2 hours after a meal

## 2016-09-13 NOTE — Progress Notes (Signed)
  Subjective:    Patient ID: Aimee Sharp, female    DOB: 26-Apr-1956, 61 y.o.   MRN: [id number]  Aimee Sharp is a 60 y.o. female who presents for follow-up of Type 2 diabetes mellitus.  Patient  Is checking home blood sugars.   Home blood sugar records: 110 to 140 How often is blood sugars being checked: tid Current symptoms/problem none Daily foot checks: yes   Any foot concerns: none Last eye exam: 07/2016 Exercise:  Fuller Mandril She continues on her Glyxambi and having no difficulty with that. She also is taking Edarbyclor with no difficulty. She is also taking Lipitor. Smoking and drinking are reviewed. The following portions of the patient's history were reviewed and updated as appropriate: allergies, current medications, past medical history, past social history and problem list.  ROS as in subjective above.     Objective:    Physical Exam Alert and in no distress otherwise not examined.    Lab Review Diabetic Labs Latest Ref Rng & Units 03/16/2016 03/15/2016 02/18/2015 10/18/2014 06/17/2014  HbA1c - >14.0 - 7.1 6.8% 7.2  Chol 0 - 200 mg/dL - - - - 121  HDL >39 mg/dL - - - - 40  Calc LDL 0 - 99 mg/dL - - - - 63  Triglycerides <150 mg/dL - - - - 92  Creatinine 0.44 - 1.00 mg/dL - 1.50(H) - - 0.74   BP/Weight 04/30/2016 04/03/2016 03/19/2016 03/16/2016 23/34/3568  Systolic BP 616 837 290 211 155  Diastolic BP 72 70 70 80 74  Wt. (Lbs) 144.4 146 - 148 171  BMI 25.58 25.86 - 26.22 30.29   Foot/eye exam completion dates Latest Ref Rng & Units 12/16/2014  Eye Exam No Retinopathy No Retinopathy  Foot Form Completion - -  Hemoglobin A1c is 7.9  Shey  reports that she has never smoked. She has never used smokeless tobacco. She reports that she does not drink alcohol or use drugs.     Assessment & Plan:    Hyperlipidemia associated with type 2 diabetes mellitus ([place]) - Plan: Lipid panel  Hypertension associated with diabetes ([place]) - Plan: CBC with  Differential/Platelet, Comprehensive metabolic panel  Type 2 diabetes mellitus without complication, without long-term current use of insulin (HCC) - Plan: POCT UA - Microalbumin, HgB A1c, CBC with Differential/Platelet, Comprehensive metabolic panel, Lipid panel  Encounter for long-term (current) use of medications - Plan: POCT UA - Microalbumin, HgB A1c, CBC with Differential/Platelet, Comprehensive metabolic panel, Lipid panel  She is stable on her present medication regimen for the above diagnoses. Did recommend that she cut back to daily checking of her blood sugar. Congratulated her on the good work that she has done. 1. Rx changes: none 2. Education: Reviewed 'ABCs' of diabetes management (respective goals in parentheses):  A1C (<7), blood pressure (<130/80), and cholesterol (LDL <100). 3. Compliance at present is estimated to be excellent. Efforts to improve compliance (if necessary) will be directed at increased exercise. 4. Follow up: 4 months

## 2016-10-22 ENCOUNTER — Other Ambulatory Visit: Payer: Self-pay | Admitting: Family Medicine

## 2016-10-22 DIAGNOSIS — E1165 Type 2 diabetes mellitus with hyperglycemia: Secondary | ICD-10-CM

## 2017-01-16 ENCOUNTER — Other Ambulatory Visit: Payer: Self-pay | Admitting: Family Medicine

## 2017-01-16 DIAGNOSIS — E1165 Type 2 diabetes mellitus with hyperglycemia: Secondary | ICD-10-CM

## 2017-01-21 ENCOUNTER — Ambulatory Visit: Payer: 59 | Admitting: Family Medicine

## 2017-02-06 ENCOUNTER — Encounter: Payer: Self-pay | Admitting: Family Medicine

## 2017-02-06 ENCOUNTER — Ambulatory Visit (INDEPENDENT_AMBULATORY_CARE_PROVIDER_SITE_OTHER): Payer: 59 | Admitting: Family Medicine

## 2017-02-06 VITALS — BP 116/70 | HR 69 | Resp 18 | Wt 149.4 lb

## 2017-02-06 DIAGNOSIS — I10 Essential (primary) hypertension: Secondary | ICD-10-CM | POA: Diagnosis not present

## 2017-02-06 DIAGNOSIS — E785 Hyperlipidemia, unspecified: Secondary | ICD-10-CM

## 2017-02-06 DIAGNOSIS — E119 Type 2 diabetes mellitus without complications: Secondary | ICD-10-CM | POA: Diagnosis not present

## 2017-02-06 DIAGNOSIS — I152 Hypertension secondary to endocrine disorders: Secondary | ICD-10-CM

## 2017-02-06 DIAGNOSIS — Z6379 Other stressful life events affecting family and household: Secondary | ICD-10-CM

## 2017-02-06 DIAGNOSIS — Z79899 Other long term (current) drug therapy: Secondary | ICD-10-CM | POA: Diagnosis not present

## 2017-02-06 DIAGNOSIS — E1169 Type 2 diabetes mellitus with other specified complication: Secondary | ICD-10-CM | POA: Diagnosis not present

## 2017-02-06 DIAGNOSIS — E1159 Type 2 diabetes mellitus with other circulatory complications: Secondary | ICD-10-CM

## 2017-02-06 DIAGNOSIS — Z23 Encounter for immunization: Secondary | ICD-10-CM | POA: Diagnosis not present

## 2017-02-06 LAB — POCT GLYCOSYLATED HEMOGLOBIN (HGB A1C): Hemoglobin A1C: 8.5

## 2017-02-06 MED ORDER — GLUCOSE BLOOD VI STRP: ORAL_STRIP | 4 refills | Status: DC

## 2017-02-06 MED ORDER — ONETOUCH DELICA LANCETS FINE MISC: 4 refills | Status: DC

## 2017-02-06 NOTE — Progress Notes (Signed)
Subjective:    Patient ID: Aimee Sharp, female    DOB: June 21, 1955, 61 y.o.   MRN: [id number]  Aimee Sharp is a 61 y.o. female who presents for follow-up of Type 2 diabetes mellitus.  Patient is checking home blood sugars.   Home blood sugar records: around 130s How often is blood sugars being checked: once a day Current symptoms/problems include none and have been unchanged. Daily foot checks: yes   Any foot concerns: none Last eye exam: appt 03/02/2017 Exercise: ballroom dancing and walking 2-3 times per week.   She continues on Glyxambi for her diabetes as well as atorvastatin and Edarbyclor. Doesn't smoke or doesn't drink. Her son apparently has a rare form of nasal cancer. She is helping take care of him in [place] which has occupied a lot of her free time in the last several months. This is also interfered with her taking good care of herself. The following portions of the patient's history were reviewed and updated as appropriate: allergies, current medications, past medical history, past social history and problem list.  ROS as in subjective above.     Objective:    Physical Exam Alert and in no distress otherwise not examined.   Lab Review Diabetic Labs Latest Ref Rng & Units 09/13/2016 03/16/2016 03/15/2016 02/18/2015 10/18/2014  HbA1c - 7.9 >14.0 - 7.1 6.8%  Microalbumin mg/L 5.1 - - - -  Micro/Creat Ratio - 5.1 - - - -  Chol <200 mg/dL 119 - - - -  HDL >50 mg/dL 44(L) - - - -  Calc LDL <100 mg/dL 57 - - - -  Triglycerides <150 mg/dL 88 - - - -  Creatinine 0.50 - 0.99 mg/dL 1.04(H) - 1.50(H) - -   BP/Weight 09/13/2016 04/30/2016 04/03/2016 03/19/2016 57/84/6962  Systolic BP 952 841 324 401 027  Diastolic BP 84 72 70 70 80  Wt. (Lbs) 146 144.4 146 - 148  BMI 25.86 25.58 25.86 - 26.22   Foot/eye exam completion dates Latest Ref Rng & Units 07/16/2016 12/16/2014  Eye Exam No Retinopathy No Retinopathy No Retinopathy  Foot Form Completion - - -  A1c is  8.5  Scout  reports that she has never smoked. She has never used smokeless tobacco. She reports that she does not drink alcohol or use drugs.     Assessment & Plan:    Hyperlipidemia associated with type 2 diabetes mellitus ([place]) - Plan: HgB A1c  Hypertension associated with diabetes ([place])  Type 2 diabetes mellitus without complication, without long-term current use of insulin ([place])  Encounter for long-term (current) use of medications  Stress due to illness of family member  Need for influenza vaccination - Plan: Flu Vaccine QUAD 6+ mos PF IM (Fluarix Quad PF)  Need for vaccination for Strep pneumoniae - Plan: Pneumococcal conjugate vaccine 13-valent    1. Rx changes: none 2. Education: Reviewed 'ABCs' of diabetes management (respective goals in parentheses):  A1C (<7), blood pressure (<130/80), and cholesterol (LDL <100). 3. Compliance at present is estimated to be fair. Efforts to improve compliance (if necessary) will be directed at Recommend she start checking her sugars 2 hours after some meals and cut back on carbohydrates.. 4. Follow up: 4 months She is under last stress helping to take care of her son. I discussed the possibility of adding another medication to her regimen because of the work and stress that she is under. She will make an attempt to adjust her eating habits. If this doesn't work I  will then add another medication to this. She seems to be handling the stress of dealing with her son's illness fairly well.

## 2017-02-06 NOTE — Patient Instructions (Addendum)
check your blood sugars 2 hours after you eat see get an idea of what that meal is doing Look at your carbohydrates in particular white foods. Kocher doses in half

## 2017-03-05 ENCOUNTER — Other Ambulatory Visit: Payer: Self-pay | Admitting: Family Medicine

## 2017-03-05 DIAGNOSIS — E1165 Type 2 diabetes mellitus with hyperglycemia: Secondary | ICD-10-CM

## 2017-05-08 ENCOUNTER — Other Ambulatory Visit: Payer: Self-pay | Admitting: Family Medicine

## 2017-05-08 DIAGNOSIS — E1159 Type 2 diabetes mellitus with other circulatory complications: Secondary | ICD-10-CM

## 2017-05-08 DIAGNOSIS — E785 Hyperlipidemia, unspecified: Secondary | ICD-10-CM

## 2017-05-08 DIAGNOSIS — E1169 Type 2 diabetes mellitus with other specified complication: Secondary | ICD-10-CM

## 2017-05-08 DIAGNOSIS — I152 Hypertension secondary to endocrine disorders: Secondary | ICD-10-CM

## 2017-05-08 DIAGNOSIS — E1165 Type 2 diabetes mellitus with hyperglycemia: Secondary | ICD-10-CM

## 2017-05-08 DIAGNOSIS — I10 Essential (primary) hypertension: Principal | ICD-10-CM

## 2017-06-10 ENCOUNTER — Ambulatory Visit: Payer: 59 | Admitting: Family Medicine

## 2017-06-10 ENCOUNTER — Encounter: Payer: Self-pay | Admitting: Family Medicine

## 2017-06-10 VITALS — BP 112/76 | HR 78 | Resp 16 | Ht 63.0 in | Wt 147.2 lb

## 2017-06-10 DIAGNOSIS — I152 Hypertension secondary to endocrine disorders: Secondary | ICD-10-CM

## 2017-06-10 DIAGNOSIS — Z1211 Encounter for screening for malignant neoplasm of colon: Secondary | ICD-10-CM

## 2017-06-10 DIAGNOSIS — E1159 Type 2 diabetes mellitus with other circulatory complications: Secondary | ICD-10-CM

## 2017-06-10 DIAGNOSIS — E119 Type 2 diabetes mellitus without complications: Secondary | ICD-10-CM | POA: Diagnosis not present

## 2017-06-10 DIAGNOSIS — E1169 Type 2 diabetes mellitus with other specified complication: Secondary | ICD-10-CM

## 2017-06-10 DIAGNOSIS — Z79899 Other long term (current) drug therapy: Secondary | ICD-10-CM

## 2017-06-10 DIAGNOSIS — Z800 Family history of malignant neoplasm of digestive organs: Secondary | ICD-10-CM | POA: Diagnosis not present

## 2017-06-10 DIAGNOSIS — I10 Essential (primary) hypertension: Secondary | ICD-10-CM

## 2017-06-10 DIAGNOSIS — E785 Hyperlipidemia, unspecified: Secondary | ICD-10-CM

## 2017-06-10 HISTORY — DX: Family history of malignant neoplasm of digestive organs: Z80.0

## 2017-06-10 LAB — POCT GLYCOSYLATED HEMOGLOBIN (HGB A1C): Hemoglobin A1C: 8.1

## 2017-06-10 MED ORDER — EXENATIDE ER 2 MG/0.85ML ~~LOC~~ AUIJ
1.0000 | AUTO-INJECTOR | SUBCUTANEOUS | 1 refills | Status: DC
Start: 2017-06-10 — End: 2017-10-22

## 2017-06-10 NOTE — Progress Notes (Signed)
  Subjective:    Patient ID: Aimee Sharp, female    DOB: 06-Feb-1956, 62 y.o.   MRN: [id number]  Aimee Sharp is a 62 y.o. female who presents for follow-up of Type 2 diabetes mellitus.  Patient is checking home blood sugars.   Home blood sugar records: BGs range between 130 and 170 How often is blood sugars being checked: once a day  Current symptoms/problems include none and have been unchanged. Daily foot checks: yes    Any foot concerns: none Last eye exam: had to reschedule due to snow last month.  Exercise: bowling and walking.  She continues on atorvastatin and is having no aches or pains with that.  She is also taking Edarbychlor for her blood pressure.  Presently she is on Glyxambi.  She did have difficulty in the past with metformin causing diarrhea.  She has lost a few pounds.  Review of her medical history indicates her mother died of colon cancer.  This occurred after she had her last colonoscopy. The following portions of the patient's history were reviewed and updated as appropriate: allergies, current medications, past medical history, past social history and problem list.  ROS as in subjective above.     Objective:    Physical Exam Alert and in no distress otherwise not examined.   Lab Review Diabetic Labs Latest Ref Rng & Units 02/06/2017 09/13/2016 03/16/2016 03/15/2016 02/18/2015  HbA1c - 8.5 7.9 >14.0 - 7.1  Microalbumin mg/L - 5.1 - - -  Micro/Creat Ratio - - 5.1 - - -  Chol <200 mg/dL - 119 - - -  HDL >50 mg/dL - 44(L) - - -  Calc LDL <100 mg/dL - 57 - - -  Triglycerides <150 mg/dL - 88 - - -  Creatinine 0.50 - 0.99 mg/dL - 1.04(H) - 1.50(H) -   BP/Weight 02/06/2017 09/13/2016 04/30/2016 04/03/2016 12/02/1599  Systolic BP 093 235 573 220 254  Diastolic BP 70 84 72 70 70  Wt. (Lbs) 149.4 146 144.4 146 -  BMI 26.47 25.86 25.58 25.86 -   Foot/eye exam completion dates Latest Ref Rng & Units 07/16/2016 12/16/2014  Eye Exam No Retinopathy No Retinopathy No  Retinopathy  Foot Form Completion - - -  A1c is 8.1  Aimee Sharp  reports that  has never smoked. she has never used smokeless tobacco. She reports that she does not drink alcohol or use drugs.     Assessment & Plan:    Type 2 diabetes mellitus without complication, without long-term current use of insulin ([place]) - Plan: HgB A1c, Exenatide ER (BYDUREON BCISE) 2 MG/0.85ML AUIJ  Screening for colon cancer - Plan: Ambulatory referral to Gastroenterology  Hyperlipidemia associated with type 2 diabetes mellitus ([place])  Hypertension associated with diabetes ([place])  Encounter for long-term (current) use of medications  Family history of colon cancer in mother - age 63 - Plan: Ambulatory referral to Gastroenterology   1. Rx changes: Bydureon BCize 2. Education: Reviewed 'ABCs' of diabetes management (respective goals in parentheses):  A1C (<7), blood pressure (<130/80), and cholesterol (LDL <100). 3. Compliance at present is estimated to be good. Efforts to improve compliance (if necessary) will be directed at Continue with present diet and exercise. 4. Follow up: 4 months Shingrix offered however patient refused.  Demonstrated to pt how to use Bydureon B-cise. Lot # [id number] exp 04/2019. Pt self injected in left abdomen. Pt verbalized understanding.

## 2017-07-05 DIAGNOSIS — H40033 Anatomical narrow angle, bilateral: Secondary | ICD-10-CM | POA: Diagnosis not present

## 2017-07-05 DIAGNOSIS — E119 Type 2 diabetes mellitus without complications: Secondary | ICD-10-CM | POA: Diagnosis not present

## 2017-07-11 ENCOUNTER — Other Ambulatory Visit: Payer: Self-pay | Admitting: Family Medicine

## 2017-07-11 DIAGNOSIS — I152 Hypertension secondary to endocrine disorders: Secondary | ICD-10-CM

## 2017-07-11 DIAGNOSIS — I10 Essential (primary) hypertension: Secondary | ICD-10-CM

## 2017-07-11 DIAGNOSIS — E785 Hyperlipidemia, unspecified: Principal | ICD-10-CM

## 2017-07-11 DIAGNOSIS — E1165 Type 2 diabetes mellitus with hyperglycemia: Secondary | ICD-10-CM

## 2017-07-11 DIAGNOSIS — E1159 Type 2 diabetes mellitus with other circulatory complications: Secondary | ICD-10-CM

## 2017-07-11 DIAGNOSIS — E1169 Type 2 diabetes mellitus with other specified complication: Secondary | ICD-10-CM

## 2017-08-19 ENCOUNTER — Other Ambulatory Visit: Payer: Self-pay | Admitting: Family Medicine

## 2017-08-19 DIAGNOSIS — I152 Hypertension secondary to endocrine disorders: Secondary | ICD-10-CM

## 2017-08-19 DIAGNOSIS — E1165 Type 2 diabetes mellitus with hyperglycemia: Secondary | ICD-10-CM

## 2017-08-19 DIAGNOSIS — E785 Hyperlipidemia, unspecified: Secondary | ICD-10-CM

## 2017-08-19 DIAGNOSIS — I10 Essential (primary) hypertension: Principal | ICD-10-CM

## 2017-08-19 DIAGNOSIS — E1159 Type 2 diabetes mellitus with other circulatory complications: Secondary | ICD-10-CM

## 2017-08-19 DIAGNOSIS — E1169 Type 2 diabetes mellitus with other specified complication: Secondary | ICD-10-CM

## 2017-09-06 ENCOUNTER — Encounter: Payer: Self-pay | Admitting: Family Medicine

## 2017-09-30 ENCOUNTER — Other Ambulatory Visit: Payer: Self-pay | Admitting: Family Medicine

## 2017-09-30 DIAGNOSIS — E1165 Type 2 diabetes mellitus with hyperglycemia: Secondary | ICD-10-CM

## 2017-09-30 DIAGNOSIS — I10 Essential (primary) hypertension: Secondary | ICD-10-CM

## 2017-09-30 DIAGNOSIS — E1169 Type 2 diabetes mellitus with other specified complication: Secondary | ICD-10-CM

## 2017-09-30 DIAGNOSIS — E1159 Type 2 diabetes mellitus with other circulatory complications: Secondary | ICD-10-CM

## 2017-09-30 DIAGNOSIS — E785 Hyperlipidemia, unspecified: Principal | ICD-10-CM

## 2017-09-30 DIAGNOSIS — I152 Hypertension secondary to endocrine disorders: Secondary | ICD-10-CM

## 2017-10-22 ENCOUNTER — Encounter: Payer: Self-pay | Admitting: Family Medicine

## 2017-10-22 ENCOUNTER — Ambulatory Visit: Payer: 59 | Admitting: Family Medicine

## 2017-10-22 ENCOUNTER — Telehealth: Payer: Self-pay

## 2017-10-22 VITALS — BP 110/72 | HR 72 | Temp 97.7°F | Ht 62.0 in | Wt 154.0 lb

## 2017-10-22 DIAGNOSIS — E785 Hyperlipidemia, unspecified: Secondary | ICD-10-CM | POA: Diagnosis not present

## 2017-10-22 DIAGNOSIS — E1159 Type 2 diabetes mellitus with other circulatory complications: Secondary | ICD-10-CM | POA: Diagnosis not present

## 2017-10-22 DIAGNOSIS — Z23 Encounter for immunization: Secondary | ICD-10-CM | POA: Diagnosis not present

## 2017-10-22 DIAGNOSIS — E1169 Type 2 diabetes mellitus with other specified complication: Secondary | ICD-10-CM

## 2017-10-22 DIAGNOSIS — Z1211 Encounter for screening for malignant neoplasm of colon: Secondary | ICD-10-CM | POA: Diagnosis not present

## 2017-10-22 DIAGNOSIS — I152 Hypertension secondary to endocrine disorders: Secondary | ICD-10-CM

## 2017-10-22 DIAGNOSIS — E119 Type 2 diabetes mellitus without complications: Secondary | ICD-10-CM | POA: Diagnosis not present

## 2017-10-22 DIAGNOSIS — I10 Essential (primary) hypertension: Secondary | ICD-10-CM | POA: Diagnosis not present

## 2017-10-22 DIAGNOSIS — Z79899 Other long term (current) drug therapy: Secondary | ICD-10-CM

## 2017-10-22 LAB — POCT UA - MICROALBUMIN
ALBUMIN/CREATININE RATIO, URINE, POC: 9.5
CREATININE, POC: 5.4 mg/dL
Microalbumin Ur, POC: 57 mg/L

## 2017-10-22 LAB — POCT GLYCOSYLATED HEMOGLOBIN (HGB A1C): HEMOGLOBIN A1C: 7.8 % — AB (ref 4.0–5.6)

## 2017-10-22 MED ORDER — EMPAGLIFLOZIN-LINAGLIPTIN 25-5 MG PO TABS: 1.0000 | ORAL_TABLET | Freq: Every day | ORAL | 1 refills | Status: DC

## 2017-10-22 MED ORDER — ATORVASTATIN CALCIUM 20 MG PO TABS: 20.0000 mg | ORAL_TABLET | Freq: Every day | ORAL | 3 refills | Status: DC

## 2017-10-22 MED ORDER — AZILSARTAN-CHLORTHALIDONE 40-25 MG PO TABS: 1.0000 | ORAL_TABLET | Freq: Every day | ORAL | 11 refills | Status: DC

## 2017-10-22 MED ORDER — EXENATIDE ER 2 MG/0.85ML ~~LOC~~ AUIJ: 1.0000 "application " | AUTO-INJECTOR | SUBCUTANEOUS | 1 refills | Status: DC

## 2017-10-22 NOTE — Telephone Encounter (Signed)
Pt was called to check which pharmacy. No answer lvm. [place]

## 2017-10-22 NOTE — Progress Notes (Signed)
  Subjective:    Patient ID: Aimee Sharp, female    DOB: 29-Dec-1955, 62 y.o.   MRN: [id number]  Aimee Sharp is a 62 y.o. female who presents for follow-up of Type 2 diabetes mellitus.  Patient is checking home blood sugars.   Home blood sugar records: meter records How often is blood sugars being checked: 1 x day Current symptoms/problems include none and have been unchanged. Daily foot checks: yes   Any foot concerns: no Last eye exam: 2019 Exercise: walking and bowling She continues on atorvastatin without difficulty.  She is also taking Glyxambi but did run out of by durian.  She has been on that for the last 3 weeks.  She did not call us to get any help with this.  Otherwise she is doing fine  The following portions of the patient's history were reviewed and updated as appropriate: allergies, current medications, past medical history, past social history and problem list.  ROS as in subjective above.     Objective:    Physical Exam Alert and in no distress otherwise not examined.   Lab Review Diabetic Labs Latest Ref Rng & Units 06/10/2017 02/06/2017 09/13/2016 03/16/2016 03/15/2016  HbA1c - 8.1 8.5 7.9 >14.0 -  Microalbumin mg/L - - 5.1 - -  Micro/Creat Ratio - - - 5.1 - -  Chol <200 mg/dL - - 119 - -  HDL >50 mg/dL - - 44(L) - -  Calc LDL <100 mg/dL - - 57 - -  Triglycerides <150 mg/dL - - 88 - -  Creatinine 0.50 - 0.99 mg/dL - - 1.04(H) - 1.50(H)   BP/Weight 06/10/2017 02/06/2017 09/13/2016 04/30/2016 63/14/9702  Systolic BP 637 858 850 277 412  Diastolic BP 76 70 84 72 70  Wt. (Lbs) 147.2 149.4 146 144.4 146  BMI 26.08 26.47 25.86 25.58 25.86   Foot/eye exam completion dates Latest Ref Rng & Units 07/16/2016 12/16/2014  Eye Exam No Retinopathy No Retinopathy No Retinopathy  Foot Form Completion - - -   A1c is 7.8 Aimee Sharp  reports that she has never smoked. She has never used smokeless tobacco. She reports that she does not drink alcohol or use drugs.     Assessment &  Plan:    Type 2 diabetes mellitus without complication, without long-term current use of insulin (HCC) - Plan: CBC with Differential/Platelet, Comprehensive metabolic panel, Lipid panel, POCT UA - Microalbumin, Exenatide ER (BYDUREON BCISE) 2 MG/0.85ML AUIJ, POCT glycosylated hemoglobin (Hb A1C)  Screening for colon cancer - Plan: Cologuard  Hyperlipidemia associated with type 2 diabetes mellitus ([place]) - Plan: Lipid panel, atorvastatin (LIPITOR) 20 MG tablet  Hypertension associated with diabetes ([place]) - Plan: CBC with Differential/Platelet, Comprehensive metabolic panel, Azilsartan-Chlorthalidone (EDARBYCLOR) 40-25 MG TABS  Encounter for long-term (current) use of medications  Need for shingles vaccine - Plan: Varicella-zoster vaccine IM (Shingrix)   1. Rx changes: none 2. Education: Reviewed 'ABCs' of diabetes management (respective goals in parentheses):  A1C (<7), blood pressure (<130/80), and cholesterol (LDL <100). 3. Compliance at present is estimated to be good. Efforts to improve compliance (if necessary) will be directed at increased exercise. 4. Follow up: 4 months I did point out to her to call us if there is ever a question about medications and getting refills.  Her A1c of 7.8 is really fairly good considering she missed 3 weeks of Bydureon

## 2017-10-23 LAB — CBC WITH DIFFERENTIAL/PLATELET
BASOS ABS: 0 10*3/uL (ref 0.0–0.2)
BASOS: 0 %
EOS (ABSOLUTE): 0.2 10*3/uL (ref 0.0–0.4)
Eos: 3 %
Hematocrit: 34.1 % (ref 34.0–46.6)
Hemoglobin: 11.3 g/dL (ref 11.1–15.9)
IMMATURE GRANS (ABS): 0 10*3/uL (ref 0.0–0.1)
Immature Granulocytes: 0 %
LYMPHS ABS: 3.8 10*3/uL — AB (ref 0.7–3.1)
LYMPHS: 45 %
MCH: 28.5 pg (ref 26.6–33.0)
MCHC: 33.1 g/dL (ref 31.5–35.7)
MCV: 86 fL (ref 79–97)
Monocytes Absolute: 0.6 10*3/uL (ref 0.1–0.9)
Monocytes: 7 %
NEUTROS ABS: 3.8 10*3/uL (ref 1.4–7.0)
Neutrophils: 45 %
PLATELETS: 282 10*3/uL (ref 150–450)
RBC: 3.97 x10E6/uL (ref 3.77–5.28)
RDW: 14.4 % (ref 12.3–15.4)
WBC: 8.5 10*3/uL (ref 3.4–10.8)

## 2017-10-23 LAB — COMPREHENSIVE METABOLIC PANEL
A/G RATIO: 1.5 (ref 1.2–2.2)
ALK PHOS: 152 IU/L — AB (ref 39–117)
ALT: 17 IU/L (ref 0–32)
AST: 21 IU/L (ref 0–40)
Albumin: 4.3 g/dL (ref 3.6–4.8)
BILIRUBIN TOTAL: 0.5 mg/dL (ref 0.0–1.2)
BUN/Creatinine Ratio: 18 (ref 12–28)
BUN: 17 mg/dL (ref 8–27)
CHLORIDE: 99 mmol/L (ref 96–106)
CO2: 25 mmol/L (ref 20–29)
Calcium: 9.7 mg/dL (ref 8.7–10.3)
Creatinine, Ser: 0.92 mg/dL (ref 0.57–1.00)
GFR calc Af Amer: 77 mL/min/{1.73_m2} (ref >59)
GFR calc non Af Amer: 67 mL/min/{1.73_m2} (ref >59)
GLOBULIN, TOTAL: 2.9 g/dL (ref 1.5–4.5)
Glucose: 121 mg/dL — ABNORMAL HIGH (ref 65–99)
Potassium: 3.1 mmol/L — ABNORMAL LOW (ref 3.5–5.2)
SODIUM: 140 mmol/L (ref 134–144)
Total Protein: 7.2 g/dL (ref 6.0–8.5)

## 2017-10-23 LAB — LIPID PANEL
CHOL/HDL RATIO: 3.3 ratio (ref 0.0–4.4)
Cholesterol, Total: 132 mg/dL (ref 100–199)
HDL: 40 mg/dL (ref >39)
LDL Calculated: 68 mg/dL (ref 0–99)
TRIGLYCERIDES: 118 mg/dL (ref 0–149)
VLDL Cholesterol Cal: 24 mg/dL (ref 5–40)

## 2017-10-29 DIAGNOSIS — Z1211 Encounter for screening for malignant neoplasm of colon: Secondary | ICD-10-CM | POA: Diagnosis not present

## 2017-10-29 LAB — COLOGUARD: Cologuard: POSITIVE

## 2017-11-12 ENCOUNTER — Other Ambulatory Visit: Payer: Self-pay

## 2017-11-12 DIAGNOSIS — R195 Other fecal abnormalities: Secondary | ICD-10-CM

## 2017-11-15 ENCOUNTER — Encounter: Payer: Self-pay | Admitting: Family Medicine

## 2017-11-20 ENCOUNTER — Encounter: Payer: Self-pay | Admitting: Internal Medicine

## 2017-11-20 ENCOUNTER — Other Ambulatory Visit: Payer: 59

## 2017-11-20 DIAGNOSIS — Z79899 Other long term (current) drug therapy: Secondary | ICD-10-CM

## 2017-11-20 LAB — ALKALINE PHOSPHATASE: ALK PHOS: 158 IU/L — AB (ref 39–117)

## 2017-11-21 ENCOUNTER — Other Ambulatory Visit: Payer: 59

## 2017-12-18 DIAGNOSIS — R195 Other fecal abnormalities: Secondary | ICD-10-CM | POA: Diagnosis not present

## 2018-02-07 ENCOUNTER — Ambulatory Visit: Payer: 59 | Admitting: Family Medicine

## 2018-02-07 ENCOUNTER — Encounter: Payer: Self-pay | Admitting: Family Medicine

## 2018-02-07 VITALS — BP 110/72 | HR 73 | Temp 98.2°F | Wt 152.8 lb

## 2018-02-07 DIAGNOSIS — E1159 Type 2 diabetes mellitus with other circulatory complications: Secondary | ICD-10-CM | POA: Diagnosis not present

## 2018-02-07 DIAGNOSIS — I10 Essential (primary) hypertension: Secondary | ICD-10-CM

## 2018-02-07 DIAGNOSIS — Z23 Encounter for immunization: Secondary | ICD-10-CM | POA: Diagnosis not present

## 2018-02-07 DIAGNOSIS — E1169 Type 2 diabetes mellitus with other specified complication: Secondary | ICD-10-CM | POA: Diagnosis not present

## 2018-02-07 DIAGNOSIS — E119 Type 2 diabetes mellitus without complications: Secondary | ICD-10-CM | POA: Diagnosis not present

## 2018-02-07 DIAGNOSIS — E785 Hyperlipidemia, unspecified: Secondary | ICD-10-CM

## 2018-02-07 DIAGNOSIS — I152 Hypertension secondary to endocrine disorders: Secondary | ICD-10-CM

## 2018-02-07 DIAGNOSIS — Z79899 Other long term (current) drug therapy: Secondary | ICD-10-CM

## 2018-02-07 LAB — POCT GLYCOSYLATED HEMOGLOBIN (HGB A1C): HEMOGLOBIN A1C: 9.2 % — AB (ref 4.0–5.6)

## 2018-02-07 NOTE — Patient Instructions (Addendum)
20 minutes of something physical every day or 150 minutes a week of something physical He has had a cut back on "white food" cut everything in half Check your glucometer and make sure is working properly and if not we will get your new

## 2018-02-07 NOTE — Progress Notes (Signed)
  Subjective:    Patient ID: Aimee Sharp, female    DOB: 11-17-1955, 62 y.o.   MRN: [id number]  Aimee Sharp is a 62 y.o. female who presents for follow-up of Type 2 diabetes mellitus.  Patient is checking home blood sugars.   Home blood sugar records: She states her blood sugars are running in the 140 range 2 hours after eating. How often is blood sugars being checked: 2-3/wk Current symptoms/problems include none and have been unchanged. Daily foot checks: yes   Any foot concerns: no Last eye exam: 2/19 Exercise: The patient does not participate in regular exercise at present. She continues on [place].  Also taking Lipitor without difficulty.  She continues on her blood pressure medication.  She cannot use metformin due to GI distress.  She does not smoke or drink. The following portions of the patient's history were reviewed and updated as appropriate: allergies, current medications, past medical history, past social history and problem list.  ROS as in subjective above.     Objective:    Physical Exam Alert and in no distress otherwise not examined.   Lab Review Diabetic Labs Latest Ref Rng & Units 10/22/2017 06/10/2017 02/06/2017 09/13/2016 03/16/2016  HbA1c 4.0 - 5.6 % 7.8(A) 8.1 8.5 7.9 >14.0  Microalbumin mg/L 57.0 - - 5.1 -  Micro/Creat Ratio - 9.5 - - 5.1 -  Chol 100 - 199 mg/dL 132 - - 119 -  HDL >39 mg/dL 40 - - 44(L) -  Calc LDL 0 - 99 mg/dL 68 - - 57 -  Triglycerides 0 - 149 mg/dL 118 - - 88 -  Creatinine 0.57 - 1.00 mg/dL 0.92 - - 1.04(H) -   BP/Weight 10/22/2017 06/10/2017 02/06/2017 09/13/2016 93/23/5573  Systolic BP 220 254 270 623 762  Diastolic BP 72 76 70 84 72  Wt. (Lbs) 154 147.2 149.4 146 144.4  BMI 28.17 26.08 26.47 25.86 25.58   Foot/eye exam completion dates Latest Ref Rng & Units 07/16/2016 12/16/2014  Eye Exam No Retinopathy No Retinopathy No Retinopathy  Foot Form Completion - - -  A1C 9.2  [name]  reports that she has never smoked.  She has never used smokeless tobacco. She reports that she does not drink alcohol or use drugs.     Assessment & Plan:    Need for shingles vaccine  Need for influenza vaccination  Hyperlipidemia associated with type 2 diabetes mellitus ([place])  Hypertension associated with diabetes ([place])  Type 2 diabetes mellitus without complication, without long-term current use of insulin ([place])  Encounter for long-term (current) use of medications    1. Rx changes: none 2. Education: Reviewed 'ABCs' of diabetes management (respective goals in parentheses):  A1C (<7), blood pressure (<130/80), and cholesterol (LDL <100). 3. Compliance at present is estimated to be poor. Efforts to improve compliance will be directed at increased exercise.  Also discussed cutting back on carbohydrates.  She admits to eating different kinds of carbohydrates including grits, biscuits and sweet tea. 4. Follow up: 4 months She is also to check her glucometer as I explained the readings 2 hours after meals should be well into the 200 range.

## 2018-02-28 DIAGNOSIS — R195 Other fecal abnormalities: Secondary | ICD-10-CM | POA: Diagnosis not present

## 2018-02-28 DIAGNOSIS — D123 Benign neoplasm of transverse colon: Secondary | ICD-10-CM | POA: Diagnosis not present

## 2018-02-28 DIAGNOSIS — K640 First degree hemorrhoids: Secondary | ICD-10-CM | POA: Diagnosis not present

## 2018-02-28 LAB — HM COLONOSCOPY

## 2018-03-14 ENCOUNTER — Encounter: Payer: Self-pay | Admitting: Family Medicine

## 2018-03-25 ENCOUNTER — Other Ambulatory Visit: Payer: Self-pay | Admitting: Family Medicine

## 2018-06-10 ENCOUNTER — Encounter: Payer: Self-pay | Admitting: Family Medicine

## 2018-06-10 ENCOUNTER — Ambulatory Visit (INDEPENDENT_AMBULATORY_CARE_PROVIDER_SITE_OTHER): Payer: 59 | Admitting: Family Medicine

## 2018-06-10 VITALS — BP 120/78 | HR 82 | Temp 98.1°F | Wt 151.8 lb

## 2018-06-10 DIAGNOSIS — Z9119 Patient's noncompliance with other medical treatment and regimen: Secondary | ICD-10-CM

## 2018-06-10 DIAGNOSIS — E785 Hyperlipidemia, unspecified: Secondary | ICD-10-CM

## 2018-06-10 DIAGNOSIS — E1169 Type 2 diabetes mellitus with other specified complication: Secondary | ICD-10-CM | POA: Diagnosis not present

## 2018-06-10 DIAGNOSIS — E1159 Type 2 diabetes mellitus with other circulatory complications: Secondary | ICD-10-CM | POA: Diagnosis not present

## 2018-06-10 DIAGNOSIS — E119 Type 2 diabetes mellitus without complications: Secondary | ICD-10-CM

## 2018-06-10 DIAGNOSIS — Z79899 Other long term (current) drug therapy: Secondary | ICD-10-CM

## 2018-06-10 DIAGNOSIS — Z91199 Patient's noncompliance with other medical treatment and regimen due to unspecified reason: Secondary | ICD-10-CM

## 2018-06-10 DIAGNOSIS — I10 Essential (primary) hypertension: Secondary | ICD-10-CM

## 2018-06-10 DIAGNOSIS — I152 Hypertension secondary to endocrine disorders: Secondary | ICD-10-CM

## 2018-06-10 LAB — POCT GLYCOSYLATED HEMOGLOBIN (HGB A1C): Hemoglobin A1C: 10.1 % — AB (ref 4.0–5.6)

## 2018-06-10 NOTE — Progress Notes (Signed)
Subjective:    Patient ID: Aimee Sharp, female    DOB: Sep 21, 1955, 63 y.o.   MRN: [id number]  Aimee Sharp is a 63 y.o. female who presents for follow-up of Type 2 diabetes mellitus.  Patient is checking home blood sugars.   Home blood sugar records: meter records How often is blood sugars being checked: 2 times a week fasting and 2 hours post meal 140-222 Current symptoms/problems include none at this time. Daily foot checks: yes  Any foot concerns: none Last eye exam: Jan. 2019 Exercise: bowling, dance She admits to having a high carbohydrate diet.  She has been through diabetes education but has not made the appropriate changes.  She states she checks her blood sugars but upon further questioning she is not checking them as often as she stated earlier.  She continues on atorvastatin and having no trouble with that.  She is also taking Edarbychlor for her blood pressure.  She continues on Bydureon and Glyxambi  The following portions of the patient's history were reviewed and updated as appropriate: allergies, current medications, past medical history, past social history and problem list.  ROS as in subjective above.     Objective:    Physical Exam Alert and in no distress otherwise not examined. A1c is 10.1  Lab Review Diabetic Labs Latest Ref Rng & Units 02/07/2018 10/22/2017 06/10/2017 02/06/2017 09/13/2016  HbA1c 4.0 - 5.6 % 9.2(A) 7.8(A) 8.1 8.5 7.9  Microalbumin mg/L - 57.0 - - 5.1  Micro/Creat Ratio - - 9.5 - - 5.1  Chol 100 - 199 mg/dL - 132 - - 119  HDL >39 mg/dL - 40 - - 44(L)  Calc LDL 0 - 99 mg/dL - 68 - - 57  Triglycerides 0 - 149 mg/dL - 118 - - 88  Creatinine 0.57 - 1.00 mg/dL - 0.92 - - 1.04(H)   BP/Weight 02/07/2018 10/22/2017 06/10/2017 02/06/2017 2/35/5732  Systolic BP 202 542 706 237 628  Diastolic BP 72 72 76 70 84  Wt. (Lbs) 152.8 154 147.2 149.4 146  BMI 27.95 28.17 26.08 26.47 25.86   Foot/eye exam completion dates Latest Ref Rng & Units 07/16/2016 12/16/2014    Eye Exam No Retinopathy No Retinopathy No Retinopathy  Foot Form Completion - - -    [place]illi[place]  reports that she has never smoked. She has never used smokeless tobacco. She reports that she does not drink alcohol or use drugs.     Assessment & Plan:    Type 2 diabetes mellitus without complication, without long-term current use of insulin (HCC) - Plan: POCT glycosylated hemoglobin (Hb A1C)  Personal history of noncompliance with medical treatment, presenting hazards to health  Hyperlipidemia associated with type 2 diabetes mellitus ([place])  Hypertension associated with diabetes ([place])  Encounter for long-term (current) use of medications   Rx changes: none   the next step would be to give her insulin.  She was reluctant to do that.  I again  She is not interested in doing that.  I again strongly encouraged her to make further dietary changes especially in reducing her carbohydrates as she mentions she is greatly overusing them.  She will return here in 4 months but if she makes no changes I will get an address the issue going on insulin. 1.  2. Education: Reviewed 'ABCs' of diabetes management (respective goals in parentheses):  A1C (<7), blood pressure (<130/80), and cholesterol (LDL <100). 3. Compliance at present is estimated to be poor. Efforts to improve compliance (if necessary)  will be directed at dietary modifications: Reduce carbohydrate consumption. 4. Follow up: 4 months

## 2018-06-27 ENCOUNTER — Telehealth: Payer: Self-pay | Admitting: Family Medicine

## 2018-06-27 DIAGNOSIS — I152 Hypertension secondary to endocrine disorders: Secondary | ICD-10-CM

## 2018-06-27 DIAGNOSIS — E119 Type 2 diabetes mellitus without complications: Secondary | ICD-10-CM

## 2018-06-27 DIAGNOSIS — E1159 Type 2 diabetes mellitus with other circulatory complications: Secondary | ICD-10-CM

## 2018-06-27 DIAGNOSIS — I10 Essential (primary) hypertension: Principal | ICD-10-CM

## 2018-06-27 MED ORDER — AZILSARTAN-CHLORTHALIDONE 40-25 MG PO TABS: 1.0000 | ORAL_TABLET | Freq: Every day | ORAL | 11 refills | Status: DC

## 2018-06-27 MED ORDER — EXENATIDE ER 2 MG/0.85ML ~~LOC~~ AUIJ: 1.0000 "application " | AUTO-INJECTOR | SUBCUTANEOUS | 1 refills | Status: DC

## 2018-06-27 MED ORDER — EMPAGLIFLOZIN-LINAGLIPTIN 25-5 MG PO TABS: 1.0000 | ORAL_TABLET | Freq: Every day | ORAL | 1 refills | Status: DC

## 2018-06-27 NOTE — Telephone Encounter (Signed)
Pt called for refills of atorvastin, Edarbyclor, Glyxambi and Bydureon. Please send to [facility]. Pt can be reached at [phone].

## 2018-06-29 ENCOUNTER — Telehealth: Payer: Self-pay | Admitting: Family Medicine

## 2018-06-29 NOTE — Telephone Encounter (Signed)
P.A. BYDUREON completed, preferred are Ozempic, Trulicity & Victoza

## 2018-06-29 NOTE — Telephone Encounter (Signed)
P.A. Carole Binning completed, preferred are the combination "sartans" with HCTZ

## 2018-07-04 MED ORDER — SEMAGLUTIDE(0.25 OR 0.5MG/DOS) 2 MG/1.5ML ~~LOC~~ SOPN: 0.5000 mg | PEN_INJECTOR | SUBCUTANEOUS | 1 refills | Status: DC

## 2018-07-04 NOTE — Telephone Encounter (Signed)
LVM for pt to call and make an appt. And to keep a record of her B/S readings and to bring to follow up appt in 1 month. [place]

## 2018-07-04 NOTE — Telephone Encounter (Signed)
Can we get this through [facility]?

## 2018-07-04 NOTE — Telephone Encounter (Signed)
P.A. Jimmy Footman denied, pt informed, she would like to try the Ozempic.  Can you switch her to Ozempic? As this is covered alternative.  (Sample bydureon given to pt because she is out, ok per [place])

## 2018-07-04 NOTE — Telephone Encounter (Signed)
I called the new medication in.  Have her come in to be seen in about a month have her keep track of her blood sugars

## 2018-07-04 NOTE — Telephone Encounter (Signed)
P.A. Carole Binning denied, pt must try formulary alternatives which are candesartan-HCTZ, irbesartan/HCTZ, losartan/HCTZ, Telmisartan/HCTZ, Valsartan/HCTZ, do you want to switch?

## 2018-07-07 MED ORDER — IRBESARTAN-HYDROCHLOROTHIAZIDE 150-12.5 MG PO TABS: 1.0000 | ORAL_TABLET | Freq: Every day | ORAL | 1 refills | Status: DC

## 2018-07-07 NOTE — Telephone Encounter (Signed)
I called the med in 

## 2018-07-07 NOTE — Telephone Encounter (Signed)
Pt was advised and follow up appt made. [place] 

## 2018-07-07 NOTE — Telephone Encounter (Signed)
This could be switched to other pharmacy & for out of pocket but cost is $40 a month.  Called pt and she can't afford $40 a month.  She would like switched to covered generic that she can afford

## 2018-08-05 ENCOUNTER — Encounter: Payer: Self-pay | Admitting: Family Medicine

## 2018-08-05 ENCOUNTER — Ambulatory Visit (INDEPENDENT_AMBULATORY_CARE_PROVIDER_SITE_OTHER): Payer: 59 | Admitting: Family Medicine

## 2018-08-05 VITALS — BP 124/82 | HR 84 | Temp 98.0°F | Wt 147.6 lb

## 2018-08-05 DIAGNOSIS — E119 Type 2 diabetes mellitus without complications: Secondary | ICD-10-CM

## 2018-08-05 LAB — POCT GLYCOSYLATED HEMOGLOBIN (HGB A1C): Hemoglobin A1C: 8.3 % — AB (ref 4.0–5.6)

## 2018-08-05 NOTE — Patient Instructions (Signed)
Work on having more frequent but smaller meals.

## 2018-08-05 NOTE — Progress Notes (Signed)
   Subjective:    Patient ID: Aimee Sharp, female    DOB: 08/25/1955, 63 y.o.   MRN: [id number]  HPI She is here for a recheck.  She is now taking Ozempic 0.5 mg and has been checking her blood sugars.  She also continues on Glyxambi.  She did bring her blood sugar readings in.  In general the readings did look fairly decent.  On her last visit her A1c was above 10.   Review of Systems     Objective:   Physical Exam Alert and in no distress.  Hemoglobin A1c is 8.3       Assessment & Plan:  Type 2 diabetes mellitus without complication, without long-term current use of insulin ([place]) - Plan: POCT glycosylated hemoglobin (Hb A1C) I reviewed the blood sugars with her in detail.  Recommended she eat more frequent but smaller meals.  She is also to maintain consistent activity level and stay on her present medications.  I will recheck her in about 2 months for a more accurate reading on her A1c.

## 2018-08-28 ENCOUNTER — Telehealth: Payer: Self-pay

## 2018-08-28 ENCOUNTER — Other Ambulatory Visit: Payer: Self-pay | Admitting: Family Medicine

## 2018-08-28 MED ORDER — ACCU-CHEK AVIVA PLUS W/DEVICE KIT: PACK | 0 refills | Status: AC

## 2018-08-28 MED ORDER — GLUCOSE BLOOD VI STRP: ORAL_STRIP | 12 refills | Status: AC

## 2018-08-28 NOTE — Telephone Encounter (Signed)
[place] sent fax stating that patient insurance will not cover [employer] products anymore. They have requested that we send Accu check Aviva Plus machine and test strips.

## 2018-10-03 ENCOUNTER — Other Ambulatory Visit: Payer: Self-pay | Admitting: Family Medicine

## 2018-10-09 ENCOUNTER — Ambulatory Visit: Payer: 59 | Admitting: Family Medicine

## 2018-11-03 ENCOUNTER — Other Ambulatory Visit: Payer: Self-pay | Admitting: Family Medicine

## 2018-11-03 DIAGNOSIS — E1169 Type 2 diabetes mellitus with other specified complication: Secondary | ICD-10-CM

## 2018-11-06 ENCOUNTER — Other Ambulatory Visit: Payer: Self-pay | Admitting: Family Medicine

## 2018-12-08 ENCOUNTER — Other Ambulatory Visit: Payer: Self-pay | Admitting: Family Medicine

## 2018-12-08 DIAGNOSIS — E1169 Type 2 diabetes mellitus with other specified complication: Secondary | ICD-10-CM

## 2019-01-04 ENCOUNTER — Other Ambulatory Visit: Payer: Self-pay | Admitting: Family Medicine

## 2019-01-04 DIAGNOSIS — E1169 Type 2 diabetes mellitus with other specified complication: Secondary | ICD-10-CM

## 2019-01-04 DIAGNOSIS — E785 Hyperlipidemia, unspecified: Secondary | ICD-10-CM

## 2019-02-10 ENCOUNTER — Other Ambulatory Visit: Payer: Self-pay | Admitting: Family Medicine

## 2019-02-10 DIAGNOSIS — E785 Hyperlipidemia, unspecified: Secondary | ICD-10-CM

## 2019-02-10 DIAGNOSIS — E1169 Type 2 diabetes mellitus with other specified complication: Secondary | ICD-10-CM

## 2019-02-12 ENCOUNTER — Encounter: Payer: Self-pay | Admitting: Family Medicine

## 2019-02-12 ENCOUNTER — Ambulatory Visit (INDEPENDENT_AMBULATORY_CARE_PROVIDER_SITE_OTHER): Payer: 59 | Admitting: Family Medicine

## 2019-02-12 ENCOUNTER — Other Ambulatory Visit: Payer: Self-pay

## 2019-02-12 VITALS — BP 122/76 | HR 80 | Temp 98.2°F | Ht 62.0 in | Wt 149.8 lb

## 2019-02-12 DIAGNOSIS — E1159 Type 2 diabetes mellitus with other circulatory complications: Secondary | ICD-10-CM | POA: Diagnosis not present

## 2019-02-12 DIAGNOSIS — Z79899 Other long term (current) drug therapy: Secondary | ICD-10-CM

## 2019-02-12 DIAGNOSIS — E1169 Type 2 diabetes mellitus with other specified complication: Secondary | ICD-10-CM

## 2019-02-12 DIAGNOSIS — Z23 Encounter for immunization: Secondary | ICD-10-CM

## 2019-02-12 DIAGNOSIS — E119 Type 2 diabetes mellitus without complications: Secondary | ICD-10-CM | POA: Diagnosis not present

## 2019-02-12 DIAGNOSIS — I10 Essential (primary) hypertension: Secondary | ICD-10-CM

## 2019-02-12 DIAGNOSIS — I152 Hypertension secondary to endocrine disorders: Secondary | ICD-10-CM

## 2019-02-12 DIAGNOSIS — E785 Hyperlipidemia, unspecified: Secondary | ICD-10-CM

## 2019-02-12 LAB — POCT GLYCOSYLATED HEMOGLOBIN (HGB A1C): Hemoglobin A1C: 5.8 % — AB (ref 4.0–5.6)

## 2019-02-12 NOTE — Progress Notes (Signed)
  Subjective:    Patient ID: Aimee Sharp, female    DOB: 1956/02/07, 63 y.o.   MRN: [id number]  Aimee Sharp is a 63 y.o. female who presents for follow-up of Type 2 diabetes mellitus.  Patient is checking home blood sugars.   Home blood sugar records: meter records How often is blood sugars being checked: bid  Fasting and two post meal 104-156 Current symptoms/problems include none at this time. Daily foot checks: yes   Any foot concerns: none Last eye exam: 06-04-2017 she plans to set up an appointment soon. Exercise: dancing and walking She continues on weekly low-dose Ozempic and is also taking Glyxambi.  Continues on irbesartan/HCT.  Atorvastatin is causing no difficulty The following portions of the patient's history were reviewed and updated as appropriate: allergies, current medications, past medical history, past social history and problem list.  ROS as in subjective above.     Objective:    Physical Exam Alert and in no distress otherwise not examined.  Hemoglobin A1c is 7.3  Lab Review Diabetic Labs Latest Ref Rng & Units 08/05/2018 06/10/2018 02/07/2018 10/22/2017 06/10/2017  HbA1c 4.0 - 5.6 % 8.3(A) 10.1(A) 9.2(A) 7.8(A) 8.1  Microalbumin mg/L - - - 57.0 -  Micro/Creat Ratio - - - - 9.5 -  Chol 100 - 199 mg/dL - - - 132 -  HDL >39 mg/dL - - - 40 -  Calc LDL 0 - 99 mg/dL - - - 68 -  Triglycerides 0 - 149 mg/dL - - - 118 -  Creatinine 0.57 - 1.00 mg/dL - - - 0.92 -   BP/Weight 08/05/2018 06/10/2018 02/07/2018 [id number]  Systolic BP A999333 123456 A999333 A999333 [id number]  Diastolic BP 82 78 72 72 76  Wt. (Lbs) 147.6 151.8 152.8 154 147.2  BMI 27 27.76 27.95 28.17 26.08   Foot/eye exam completion dates Latest Ref Rng & Units 07/16/2016 12/16/2014  Eye Exam No Retinopathy No Retinopathy No Retinopathy  Foot Form Completion - - -    Aimee Sharp  reports that she has never smoked. She has never used smokeless tobacco. She reports that she does not drink alcohol or use drugs.     Assessment &  Plan:    Type 2 diabetes mellitus without complication, without long-term current use of insulin (HCC) - Plan: CBC with Differential/Platelet, Comprehensive metabolic panel, Lipid panel, POCT UA - Microalbumin  Hypertension associated with diabetes ([place]) - Plan: CBC with Differential/Platelet, Comprehensive metabolic panel  Hyperlipidemia associated with type 2 diabetes mellitus ([place]) - Plan: Lipid panel  Encounter for long-term (current) use of medications - Plan: CBC with Differential/Platelet, Comprehensive metabolic panel, Lipid panel, POCT UA - Microalbumin  Need for influenza vaccination - Plan: Flu Vaccine QUAD 6+ mos PF IM (Fluarix Quad PF)   1. Rx changes: none 2. Education: Reviewed 'ABCs' of diabetes management (respective goals in parentheses):  A1C (<7), blood pressure (<130/80), and cholesterol (LDL <100). 3. Compliance at present is estimated to be good. Efforts to improve compliance (if necessary) will be directed at Continue with present medication regimen.. 4. Follow up: 4 months  I congratulated her on the excellent work that she has done.

## 2019-02-13 LAB — LIPID PANEL
Chol/HDL Ratio: 3.3 ratio (ref 0.0–4.4)
Cholesterol, Total: 137 mg/dL (ref 100–199)
HDL: 41 mg/dL (ref >39)
LDL Chol Calc (NIH): 80 mg/dL (ref 0–99)
Triglycerides: 85 mg/dL (ref 0–149)
VLDL Cholesterol Cal: 16 mg/dL (ref 5–40)

## 2019-02-13 LAB — COMPREHENSIVE METABOLIC PANEL
ALT: 22 IU/L (ref 0–32)
AST: 20 IU/L (ref 0–40)
Albumin/Globulin Ratio: 1.2 (ref 1.2–2.2)
Albumin: 4.2 g/dL (ref 3.8–4.8)
Alkaline Phosphatase: 153 IU/L — ABNORMAL HIGH (ref 39–117)
BUN/Creatinine Ratio: 15 (ref 12–28)
BUN: 12 mg/dL (ref 8–27)
Bilirubin Total: 0.5 mg/dL (ref 0.0–1.2)
CO2: 25 mmol/L (ref 20–29)
Calcium: 9.7 mg/dL (ref 8.7–10.3)
Chloride: 106 mmol/L (ref 96–106)
Creatinine, Ser: 0.8 mg/dL (ref 0.57–1.00)
GFR calc Af Amer: 91 mL/min/{1.73_m2} (ref >59)
GFR calc non Af Amer: 79 mL/min/{1.73_m2} (ref >59)
Globulin, Total: 3.4 g/dL (ref 1.5–4.5)
Glucose: 97 mg/dL (ref 65–99)
Potassium: 3.7 mmol/L (ref 3.5–5.2)
Sodium: 144 mmol/L (ref 134–144)
Total Protein: 7.6 g/dL (ref 6.0–8.5)

## 2019-02-13 LAB — CBC WITH DIFFERENTIAL/PLATELET
Basophils Absolute: 0 10*3/uL (ref 0.0–0.2)
Basos: 0 %
EOS (ABSOLUTE): 0.1 10*3/uL (ref 0.0–0.4)
Eos: 2 %
Hematocrit: 38 % (ref 34.0–46.6)
Hemoglobin: 11.9 g/dL (ref 11.1–15.9)
Immature Grans (Abs): 0 10*3/uL (ref 0.0–0.1)
Immature Granulocytes: 0 %
Lymphocytes Absolute: 3.3 10*3/uL — ABNORMAL HIGH (ref 0.7–3.1)
Lymphs: 42 %
MCH: 27.9 pg (ref 26.6–33.0)
MCHC: 31.3 g/dL — ABNORMAL LOW (ref 31.5–35.7)
MCV: 89 fL (ref 79–97)
Monocytes Absolute: 0.4 10*3/uL (ref 0.1–0.9)
Monocytes: 5 %
Neutrophils Absolute: 3.9 10*3/uL (ref 1.4–7.0)
Neutrophils: 51 %
Platelets: 246 10*3/uL (ref 150–450)
RBC: 4.26 x10E6/uL (ref 3.77–5.28)
RDW: 14 % (ref 11.7–15.4)
WBC: 7.7 10*3/uL (ref 3.4–10.8)

## 2019-03-25 ENCOUNTER — Telehealth: Payer: Self-pay

## 2019-03-25 NOTE — Telephone Encounter (Signed)
Called pt to advised the of need to complete gaps in chart and if completed when and where the screening took Place. Kh

## 2019-03-31 ENCOUNTER — Other Ambulatory Visit: Payer: Self-pay | Admitting: Family Medicine

## 2019-03-31 DIAGNOSIS — E785 Hyperlipidemia, unspecified: Secondary | ICD-10-CM

## 2019-03-31 DIAGNOSIS — E1169 Type 2 diabetes mellitus with other specified complication: Secondary | ICD-10-CM

## 2019-05-09 ENCOUNTER — Other Ambulatory Visit: Payer: Self-pay | Admitting: Family Medicine

## 2019-06-15 ENCOUNTER — Telehealth: Payer: Self-pay

## 2019-06-15 NOTE — Telephone Encounter (Signed)
She should be scheduled for an exam sometime soon.  I will discuss that with her at that point

## 2019-06-15 NOTE — Telephone Encounter (Signed)
We had recived a form asking about pt med. Pt advised office she is taking both glyxambi and ozempic. American diabetes recommends one be D/C. Please advise. [place]

## 2019-06-17 ENCOUNTER — Ambulatory Visit (INDEPENDENT_AMBULATORY_CARE_PROVIDER_SITE_OTHER): Payer: 59 | Admitting: Family Medicine

## 2019-06-17 ENCOUNTER — Other Ambulatory Visit: Payer: Self-pay

## 2019-06-17 ENCOUNTER — Encounter: Payer: Self-pay | Admitting: Family Medicine

## 2019-06-17 VITALS — BP 130/84 | HR 98 | Temp 98.4°F | Wt 146.2 lb

## 2019-06-17 DIAGNOSIS — I152 Hypertension secondary to endocrine disorders: Secondary | ICD-10-CM

## 2019-06-17 DIAGNOSIS — E1169 Type 2 diabetes mellitus with other specified complication: Secondary | ICD-10-CM

## 2019-06-17 DIAGNOSIS — I10 Essential (primary) hypertension: Secondary | ICD-10-CM

## 2019-06-17 DIAGNOSIS — Z79899 Other long term (current) drug therapy: Secondary | ICD-10-CM | POA: Diagnosis not present

## 2019-06-17 DIAGNOSIS — E1159 Type 2 diabetes mellitus with other circulatory complications: Secondary | ICD-10-CM | POA: Diagnosis not present

## 2019-06-17 DIAGNOSIS — E559 Vitamin D deficiency, unspecified: Secondary | ICD-10-CM

## 2019-06-17 DIAGNOSIS — E785 Hyperlipidemia, unspecified: Secondary | ICD-10-CM

## 2019-06-17 DIAGNOSIS — E119 Type 2 diabetes mellitus without complications: Secondary | ICD-10-CM | POA: Diagnosis not present

## 2019-06-17 LAB — POCT GLYCOSYLATED HEMOGLOBIN (HGB A1C): Hemoglobin A1C: 7.3 % — AB (ref 4.0–5.6)

## 2019-06-17 MED ORDER — JARDIANCE 25 MG PO TABS: 25.0000 mg | ORAL_TABLET | Freq: Every day | ORAL | 1 refills | Status: DC

## 2019-06-17 NOTE — Progress Notes (Addendum)
  Subjective:    Patient ID: Aimee Sharp, female    DOB: 1956-05-18, 64 y.o.   MRN: [id number]  Aimee Sharp is a 64 y.o. female who presents for follow-up of Type 2 diabetes mellitus.  Home blood sugar records: meter records 121-149 fasting and post meal Current symptoms/problems include none at this time . Daily foot checks: yes   Any foot concerns: right leg and toes tingling Exercise: walking 30 min a day Diet: regular.decreased carbs She is taking irbesartan/HCTZ and having no difficulty with that.  She takes Ozempic weekly.  She is also taking Glyxambi.  She continues on Lipitor without muscle aches or pains she continues on a good multivitamin. she is also taking 5000 international units of vitamin D that she started because of Covid. She also complains of intermittent tingling sensation over the last week of the lateral aspect of her right calf. The following portions of the patient's history were reviewed and updated as appropriate: allergies, current medications, past medical history, past social history and problem list.  ROS as in subjective above.     Objective:    Physical Exam Alert and in no distress otherwise not examined.  Hemoglobin A1c is 7.3 Lab Review Diabetic Labs Latest Ref Rng & Units 02/12/2019 08/05/2018 06/10/2018 02/07/2018 10/22/2017  HbA1c 4.0 - 5.6 % 5.8(A) 8.3(A) 10.1(A) 9.2(A) 7.8(A)  Microalbumin mg/L - - - - 57.0  Micro/Creat Ratio - - - - - 9.5  Chol 100 - 199 mg/dL 137 - - - 132  HDL >39 mg/dL 41 - - - 40  Calc LDL 0 - 99 mg/dL 80 - - - 68  Triglycerides 0 - 149 mg/dL 85 - - - 118  Creatinine 0.57 - 1.00 mg/dL 0.80 - - - 0.92   BP/Weight 06/17/2019 02/12/2019 08/05/2018 0000000 [id number]  Systolic BP [id number] A999333 123456 A999333  Diastolic BP 84 76 82 78 72  Wt. (Lbs) 146.2 149.8 147.6 151.8 152.8  BMI 26.74 27.4 27 27.76 27.95   Foot/eye exam completion dates Latest Ref Rng & Units 02/12/2019 07/16/2016  Eye Exam No Retinopathy - No Retinopathy  Foot Form  Completion - Done -    Aimee Sharp  reports that she has never smoked. She has never used smokeless tobacco. She reports that she does not drink alcohol or use drugs.     Assessment & Plan:    High risk medication use - Plan: Vitamin D 25 hydroxy  Type 2 diabetes mellitus without complication, without long-term current use of insulin (HCC) - Plan: empagliflozin (JARDIANCE) 25 MG TABS tablet  Hypertension associated with diabetes ([place])  Hyperlipidemia associated with type 2 diabetes mellitus ([place])   1. Rx changes: Stop Glyxambi and start Jardiance 2. Education: Reviewed 'ABCs' of diabetes management (respective goals in parentheses):  A1C (<7), blood pressure (<130/80), and cholesterol (LDL <100). 3. Compliance at present is estimated to be good. Efforts to improve compliance (if necessary) will be directed at Continue with present medication and exercise regimen. 4. Follow up: 4 months I discussed the tingling with her and we will take a watchful waiting approach to that.  She was comfortable with that. 06/18/19 vitamin D level came back low.  She is to continue on 5000 units/day.

## 2019-06-17 NOTE — Patient Instructions (Addendum)
Stop taking Glyxambi

## 2019-06-17 NOTE — Addendum Note (Signed)
Addended by: Elyse Jarvis on: 06/17/2019 11:07 AM   Modules accepted: Orders

## 2019-06-18 LAB — VITAMIN D 25 HYDROXY (VIT D DEFICIENCY, FRACTURES): Vit D, 25-Hydroxy: 16.5 ng/mL — ABNORMAL LOW (ref 30.0–100.0)

## 2019-06-22 ENCOUNTER — Telehealth: Payer: Self-pay | Admitting: Family Medicine

## 2019-06-22 NOTE — Telephone Encounter (Signed)
Pt was advised [facility] 

## 2019-06-22 NOTE — Telephone Encounter (Signed)
Pt called and said since she has switched to the [place] she has had dry mouth and been light headed. She is wondering if that is a side effect of the medication

## 2019-06-22 NOTE — Telephone Encounter (Signed)
Let her know that I do not think that that is the cause but 1 way to find out would be to have her stop it for a few days and see if the symptoms go away.  If they do then I would still start it again after that and if they come back we will have to switch.

## 2019-06-23 ENCOUNTER — Ambulatory Visit: Payer: 59 | Attending: Internal Medicine

## 2019-06-23 DIAGNOSIS — Z20822 Contact with and (suspected) exposure to covid-19: Secondary | ICD-10-CM

## 2019-06-24 LAB — NOVEL CORONAVIRUS, NAA: SARS-CoV-2, NAA: NOT DETECTED

## 2019-07-27 ENCOUNTER — Other Ambulatory Visit: Payer: Self-pay | Admitting: Family Medicine

## 2019-07-29 ENCOUNTER — Other Ambulatory Visit: Payer: Self-pay | Admitting: Family Medicine

## 2019-07-29 DIAGNOSIS — E785 Hyperlipidemia, unspecified: Secondary | ICD-10-CM

## 2019-07-29 DIAGNOSIS — E1169 Type 2 diabetes mellitus with other specified complication: Secondary | ICD-10-CM

## 2019-08-04 ENCOUNTER — Telehealth: Payer: Self-pay | Admitting: Family Medicine

## 2019-08-04 NOTE — Telephone Encounter (Signed)
Yes, whenever she qualifies

## 2019-08-04 NOTE — Telephone Encounter (Signed)
Pt was advised [facility] 

## 2019-08-04 NOTE — Telephone Encounter (Signed)
Pt called and wants to know if you think she is ok to get the COIVD shot pt can be reached at [phone]

## 2019-09-02 ENCOUNTER — Other Ambulatory Visit: Payer: Self-pay | Admitting: Family Medicine

## 2019-10-14 ENCOUNTER — Encounter: Payer: Self-pay | Admitting: Family Medicine

## 2019-10-14 ENCOUNTER — Ambulatory Visit (INDEPENDENT_AMBULATORY_CARE_PROVIDER_SITE_OTHER): Payer: 59 | Admitting: Family Medicine

## 2019-10-14 ENCOUNTER — Other Ambulatory Visit: Payer: Self-pay

## 2019-10-14 VITALS — BP 134/86 | HR 82 | Temp 97.3°F | Wt 146.6 lb

## 2019-10-14 DIAGNOSIS — E1169 Type 2 diabetes mellitus with other specified complication: Secondary | ICD-10-CM

## 2019-10-14 DIAGNOSIS — Z79899 Other long term (current) drug therapy: Secondary | ICD-10-CM | POA: Diagnosis not present

## 2019-10-14 DIAGNOSIS — I152 Hypertension secondary to endocrine disorders: Secondary | ICD-10-CM

## 2019-10-14 DIAGNOSIS — E1159 Type 2 diabetes mellitus with other circulatory complications: Secondary | ICD-10-CM | POA: Diagnosis not present

## 2019-10-14 DIAGNOSIS — E785 Hyperlipidemia, unspecified: Secondary | ICD-10-CM

## 2019-10-14 DIAGNOSIS — I10 Essential (primary) hypertension: Secondary | ICD-10-CM | POA: Diagnosis not present

## 2019-10-14 LAB — POCT GLYCOSYLATED HEMOGLOBIN (HGB A1C): Hemoglobin A1C: 7 % — AB (ref 4.0–5.6)

## 2019-10-14 LAB — POCT UA - MICROALBUMIN
Albumin/Creatinine Ratio, Urine, POC: 20.7
Creatinine, POC: 117.7 mg/dL
Microalbumin Ur, POC: 24.4 mg/L

## 2019-10-14 NOTE — Progress Notes (Signed)
  Subjective:    Patient ID: Aimee Sharp, female    DOB: 1956/04/10, 64 y.o.   MRN: [id number]  Aimee Sharp is a 64 y.o. female who presents for follow-up of Type 2 diabetes mellitus.  Home blood sugar records: meter record, fasting and post meal, 114-180 Current symptoms/problems include none at this time . Daily foot checks: yes   Any foot concerns: cold feeling and sometime numb. Exercise: walking daily Diet: fair She continues on Avalide and having no difficulty with that.  She is also using atorvastatin without aches or pains.  Jardiance and Ozempic seem to be causing no difficulties.  She does not smoke or drink. The following portions of the patient's history were reviewed and updated as appropriate: allergies, current medications, past medical history, past social history and problem list.  ROS as in subjective above.     Objective:    Physical Exam Alert and in no distress otherwise not examined. Hemoglobin A1c is 7.0 Lab Review Diabetic Labs Latest Ref Rng & Units 06/17/2019 02/12/2019 08/05/2018 06/10/2018 02/07/2018  HbA1c 4.0 - 5.6 % 7.3(A) 5.8(A) 8.3(A) 10.1(A) 9.2(A)  Microalbumin mg/L - - - - -  Micro/Creat Ratio - - - - - -  Chol 100 - 199 mg/dL - 137 - - -  HDL >39 mg/dL - 41 - - -  Calc LDL 0 - 99 mg/dL - 80 - - -  Triglycerides 0 - 149 mg/dL - 85 - - -  Creatinine 0.57 - 1.00 mg/dL - 0.80 - - -   BP/Weight 06/17/2019 02/12/2019 08/05/2018 0000000 [id number]  Systolic BP [id number] A999333 123456 A999333  Diastolic BP 84 76 82 78 72  Wt. (Lbs) 146.2 149.8 147.6 151.8 152.8  BMI 26.74 27.4 27 27.76 27.95   Foot/eye exam completion dates Latest Ref Rng & Units 02/12/2019 07/16/2016  Eye Exam No Retinopathy - No Retinopathy  Foot Form Completion - Done -    Aimee Sharp  reports that she has never smoked. She has never used smokeless tobacco. She reports that she does not drink alcohol or use drugs.     Assessment & Plan:    Hypertension associated with diabetes ([place])  Hyperlipidemia  associated with type 2 diabetes mellitus ([place])  Encounter for long-term (current) use of medications  Type 2 diabetes mellitus with other specified complication, without long-term current use of insulin ([place])    1. Rx changes: none 2. Education: Reviewed 'ABCs' of diabetes management (respective goals in parentheses):  A1C (<7), blood pressure (<130/80), and cholesterol (LDL <100). 3. Compliance at present is estimated to be excellent. Efforts to improve compliance (if necessary) will be directed at Continue with present diet and exercise. 4. Follow up: 6 months I congratulated her on taking good care of herself and will see her in 6 months.  Discussed getting the flu shot next fall.

## 2019-10-14 NOTE — Addendum Note (Signed)
Addended by: Elyse Jarvis on: 10/14/2019 04:35 PM   Modules accepted: Orders

## 2019-10-14 NOTE — Addendum Note (Signed)
Addended by: Elyse Jarvis on: 10/14/2019 11:29 AM   Modules accepted: Orders

## 2019-10-15 ENCOUNTER — Ambulatory Visit: Payer: 59 | Admitting: Family Medicine

## 2019-11-28 ENCOUNTER — Other Ambulatory Visit: Payer: Self-pay | Admitting: Family Medicine

## 2019-12-07 ENCOUNTER — Other Ambulatory Visit: Payer: Self-pay | Admitting: Family Medicine

## 2019-12-07 DIAGNOSIS — E1169 Type 2 diabetes mellitus with other specified complication: Secondary | ICD-10-CM

## 2019-12-07 DIAGNOSIS — E785 Hyperlipidemia, unspecified: Secondary | ICD-10-CM

## 2020-01-02 ENCOUNTER — Other Ambulatory Visit: Payer: Self-pay | Admitting: Family Medicine

## 2020-01-11 ENCOUNTER — Other Ambulatory Visit: Payer: Self-pay

## 2020-01-11 DIAGNOSIS — E119 Type 2 diabetes mellitus without complications: Secondary | ICD-10-CM

## 2020-01-11 MED ORDER — EMPAGLIFLOZIN 25 MG PO TABS: 25.0000 mg | ORAL_TABLET | Freq: Every day | ORAL | 1 refills | Status: DC

## 2020-01-12 ENCOUNTER — Other Ambulatory Visit: Payer: Self-pay | Admitting: Family Medicine

## 2020-01-12 DIAGNOSIS — E1169 Type 2 diabetes mellitus with other specified complication: Secondary | ICD-10-CM

## 2020-03-07 ENCOUNTER — Other Ambulatory Visit: Payer: Self-pay | Admitting: Family Medicine

## 2020-03-11 ENCOUNTER — Other Ambulatory Visit: Payer: Self-pay | Admitting: Family Medicine

## 2020-03-11 DIAGNOSIS — E1169 Type 2 diabetes mellitus with other specified complication: Secondary | ICD-10-CM

## 2020-03-11 DIAGNOSIS — E785 Hyperlipidemia, unspecified: Secondary | ICD-10-CM

## 2020-03-16 ENCOUNTER — Encounter: Payer: Self-pay | Admitting: Family Medicine

## 2020-04-15 ENCOUNTER — Other Ambulatory Visit: Payer: Self-pay | Admitting: Family Medicine

## 2020-04-15 ENCOUNTER — Ambulatory Visit: Payer: 59 | Admitting: Family Medicine

## 2020-04-15 DIAGNOSIS — E1169 Type 2 diabetes mellitus with other specified complication: Secondary | ICD-10-CM

## 2020-04-15 DIAGNOSIS — E785 Hyperlipidemia, unspecified: Secondary | ICD-10-CM

## 2020-04-22 ENCOUNTER — Other Ambulatory Visit: Payer: Self-pay

## 2020-04-22 ENCOUNTER — Encounter: Payer: Self-pay | Admitting: Family Medicine

## 2020-04-22 ENCOUNTER — Ambulatory Visit (INDEPENDENT_AMBULATORY_CARE_PROVIDER_SITE_OTHER): Payer: 59 | Admitting: Family Medicine

## 2020-04-22 VITALS — BP 140/84 | HR 88 | Temp 98.1°F | Wt 149.2 lb

## 2020-04-22 DIAGNOSIS — T383X5A Adverse effect of insulin and oral hypoglycemic [antidiabetic] drugs, initial encounter: Secondary | ICD-10-CM | POA: Insufficient documentation

## 2020-04-22 DIAGNOSIS — E559 Vitamin D deficiency, unspecified: Secondary | ICD-10-CM | POA: Diagnosis not present

## 2020-04-22 DIAGNOSIS — E1159 Type 2 diabetes mellitus with other circulatory complications: Secondary | ICD-10-CM

## 2020-04-22 DIAGNOSIS — Z1231 Encounter for screening mammogram for malignant neoplasm of breast: Secondary | ICD-10-CM

## 2020-04-22 DIAGNOSIS — Z23 Encounter for immunization: Secondary | ICD-10-CM

## 2020-04-22 DIAGNOSIS — E1169 Type 2 diabetes mellitus with other specified complication: Secondary | ICD-10-CM

## 2020-04-22 DIAGNOSIS — E119 Type 2 diabetes mellitus without complications: Secondary | ICD-10-CM | POA: Diagnosis not present

## 2020-04-22 DIAGNOSIS — Z79899 Other long term (current) drug therapy: Secondary | ICD-10-CM

## 2020-04-22 DIAGNOSIS — E785 Hyperlipidemia, unspecified: Secondary | ICD-10-CM

## 2020-04-22 DIAGNOSIS — I152 Hypertension secondary to endocrine disorders: Secondary | ICD-10-CM

## 2020-04-22 LAB — LIPID PANEL

## 2020-04-22 LAB — POCT GLYCOSYLATED HEMOGLOBIN (HGB A1C): Hemoglobin A1C: 7.4 % — AB (ref 4.0–5.6)

## 2020-04-22 MED ORDER — ATORVASTATIN CALCIUM 20 MG PO TABS: 20.0000 mg | ORAL_TABLET | Freq: Every day | ORAL | 3 refills | Status: DC

## 2020-04-22 MED ORDER — IRBESARTAN-HYDROCHLOROTHIAZIDE 150-12.5 MG PO TABS: 1.0000 | ORAL_TABLET | Freq: Every day | ORAL | 3 refills | Status: DC

## 2020-04-22 MED ORDER — OZEMPIC (0.25 OR 0.5 MG/DOSE) 2 MG/1.5ML ~~LOC~~ SOPN: PEN_INJECTOR | SUBCUTANEOUS | 5 refills | Status: DC

## 2020-04-22 MED ORDER — EMPAGLIFLOZIN 25 MG PO TABS: 25.0000 mg | ORAL_TABLET | Freq: Every day | ORAL | 1 refills | Status: DC

## 2020-04-22 NOTE — Progress Notes (Signed)
  Subjective:    Patient ID: Aimee Sharp, female    DOB: 05-Jan-1956, 64 y.o.   MRN: [id number]  Aimee Sharp is a 64 y.o. female who presents for follow-up of Type 2 diabetes mellitus.  Home blood sugar records: meter records, fasting and post meal, 124-141 Current symptoms/problems include none at this time. Daily foot checks:yes   Any foot concerns: right foot is numb Exercise: staying active.  She is now retired does have a part-time job.   Diet: good She continues on Ozempic and Jardiance.  She was intolerant to Metformin.  Continues on Lipitor without muscle aches or pains.  Is also taking Edarbychlor without difficulty.  She did have an eye exam in April.  The following portions of the patient's history were reviewed and updated as appropriate: allergies, current medications, past medical history, past social history and problem list.  ROS as in subjective above.     Objective:    Physical Exam Alert and in no distress foot exam normal Hemoglobin A1c is 7.4 Lab Review Diabetic Labs Latest Ref Rng & Units 10/14/2019 06/17/2019 02/12/2019 08/05/2018 06/10/2018  HbA1c 4.0 - 5.6 % 7.0(A) 7.3(A) 5.8(A) 8.3(A) 10.1(A)  Microalbumin mg/L 24.4 - - - -  Micro/Creat Ratio - 20.7 - - - -  Chol 100 - 199 mg/dL - - 137 - -  HDL >39 mg/dL - - 41 - -  Calc LDL 0 - 99 mg/dL - - 80 - -  Triglycerides 0 - 149 mg/dL - - 85 - -  Creatinine 0.57 - 1.00 mg/dL - - 0.80 - -   BP/Weight 10/14/2019 06/17/2019 02/12/2019 08/04/231 09/04/5684  Systolic BP 168 372 902 111 552  Diastolic BP 86 84 76 82 78  Wt. (Lbs) 146.6 146.2 149.8 147.6 151.8  BMI 26.81 26.74 27.4 27 27.76   Foot/eye exam completion dates Latest Ref Rng & Units 02/12/2019 07/16/2016  Eye Exam No Retinopathy - No Retinopathy  Foot Form Completion - Done -    [name]  reports that she has never smoked. She has never used smokeless tobacco. She reports that she does not drink alcohol and does not use drugs.     Assessment & Plan:    Type 2  diabetes mellitus without complication, without long-term current use of insulin (HCC)  Hypertension associated with diabetes ([place])  Hyperlipidemia associated with type 2 diabetes mellitus ([place])  Adverse effect of metformin, initial encounter  Vitamin D insufficiency  Encounter for long-term (current) use of medications   1. Rx changes: none 2. Education: Reviewed 'ABCs' of diabetes management (respective goals in parentheses):  A1C (<7), blood pressure (<130/80), and cholesterol (LDL <100). 3. Compliance at present is estimated to be fair. Efforts to improve compliance (if necessary) will be directed at dietary modifications: Cut back on carbohydrates. 4. Follow up: 4 months She will be set up for Pap and pelvic.

## 2020-04-23 LAB — CBC WITH DIFFERENTIAL/PLATELET
Basophils Absolute: 0 10*3/uL (ref 0.0–0.2)
Basos: 0 %
EOS (ABSOLUTE): 0.1 10*3/uL (ref 0.0–0.4)
Eos: 1 %
Hematocrit: 39.8 % (ref 34.0–46.6)
Hemoglobin: 13.2 g/dL (ref 11.1–15.9)
Immature Grans (Abs): 0 10*3/uL (ref 0.0–0.1)
Immature Granulocytes: 0 %
Lymphocytes Absolute: 3.5 10*3/uL — ABNORMAL HIGH (ref 0.7–3.1)
Lymphs: 37 %
MCH: 29.1 pg (ref 26.6–33.0)
MCHC: 33.2 g/dL (ref 31.5–35.7)
MCV: 88 fL (ref 79–97)
Monocytes Absolute: 0.5 10*3/uL (ref 0.1–0.9)
Monocytes: 5 %
Neutrophils Absolute: 5.2 10*3/uL (ref 1.4–7.0)
Neutrophils: 57 %
Platelets: 262 10*3/uL (ref 150–450)
RBC: 4.53 x10E6/uL (ref 3.77–5.28)
RDW: 12.8 % (ref 11.7–15.4)
WBC: 9.4 10*3/uL (ref 3.4–10.8)

## 2020-04-23 LAB — COMPREHENSIVE METABOLIC PANEL
ALT: 20 IU/L (ref 0–32)
AST: 19 IU/L (ref 0–40)
Albumin/Globulin Ratio: 1.2 (ref 1.2–2.2)
Albumin: 4.4 g/dL (ref 3.8–4.8)
Alkaline Phosphatase: 218 IU/L — ABNORMAL HIGH (ref 44–121)
BUN/Creatinine Ratio: 14 (ref 12–28)
BUN: 13 mg/dL (ref 8–27)
Bilirubin Total: 0.7 mg/dL (ref 0.0–1.2)
CO2: 25 mmol/L (ref 20–29)
Calcium: 9.5 mg/dL (ref 8.7–10.3)
Chloride: 97 mmol/L (ref 96–106)
Creatinine, Ser: 0.94 mg/dL (ref 0.57–1.00)
GFR calc Af Amer: 74 mL/min/{1.73_m2} (ref >59)
GFR calc non Af Amer: 64 mL/min/{1.73_m2} (ref >59)
Globulin, Total: 3.6 g/dL (ref 1.5–4.5)
Glucose: 105 mg/dL — ABNORMAL HIGH (ref 65–99)
Potassium: 3.3 mmol/L — ABNORMAL LOW (ref 3.5–5.2)
Sodium: 139 mmol/L (ref 134–144)
Total Protein: 8 g/dL (ref 6.0–8.5)

## 2020-04-23 LAB — LIPID PANEL
Chol/HDL Ratio: 3.1 ratio (ref 0.0–4.4)
Cholesterol, Total: 147 mg/dL (ref 100–199)
HDL: 47 mg/dL (ref >39)
LDL Chol Calc (NIH): 80 mg/dL (ref 0–99)
Triglycerides: 109 mg/dL (ref 0–149)
VLDL Cholesterol Cal: 20 mg/dL (ref 5–40)

## 2020-04-23 LAB — VITAMIN D 25 HYDROXY (VIT D DEFICIENCY, FRACTURES): Vit D, 25-Hydroxy: 16.2 ng/mL — ABNORMAL LOW (ref 30.0–100.0)

## 2020-04-28 LAB — ALKALINE PHOSPHATASE, ISOENZYMES
Alkaline Phosphatase: 209 IU/L — ABNORMAL HIGH (ref 44–121)
BONE FRACTION: 32 % (ref 14–68)
INTESTINAL FRAC.: 3 % (ref 0–18)
LIVER FRACTION: 65 % (ref 18–85)

## 2020-04-28 LAB — SPECIMEN STATUS REPORT

## 2020-05-02 ENCOUNTER — Other Ambulatory Visit: Payer: Self-pay

## 2020-05-02 DIAGNOSIS — R748 Abnormal levels of other serum enzymes: Secondary | ICD-10-CM

## 2020-06-08 ENCOUNTER — Ambulatory Visit: Payer: 59 | Admitting: Family Medicine

## 2020-06-08 ENCOUNTER — Other Ambulatory Visit (HOSPITAL_COMMUNITY)
Admission: RE | Admit: 2020-06-08 | Discharge: 2020-06-08 | Disposition: A | Discharge status: Home / Self Care | Payer: 59 | Source: Ambulatory Visit | Attending: Family Medicine | Admitting: Family Medicine

## 2020-06-08 ENCOUNTER — Encounter: Payer: Self-pay | Admitting: Family Medicine

## 2020-06-08 ENCOUNTER — Other Ambulatory Visit: Payer: Self-pay

## 2020-06-08 VITALS — BP 128/86 | HR 85 | Temp 97.7°F | Ht 62.0 in | Wt 148.6 lb

## 2020-06-08 DIAGNOSIS — Z0000 Encounter for general adult medical examination without abnormal findings: Secondary | ICD-10-CM

## 2020-06-08 DIAGNOSIS — E1159 Type 2 diabetes mellitus with other circulatory complications: Secondary | ICD-10-CM

## 2020-06-08 DIAGNOSIS — I152 Hypertension secondary to endocrine disorders: Secondary | ICD-10-CM

## 2020-06-08 DIAGNOSIS — E1169 Type 2 diabetes mellitus with other specified complication: Secondary | ICD-10-CM

## 2020-06-08 DIAGNOSIS — Z124 Encounter for screening for malignant neoplasm of cervix: Secondary | ICD-10-CM | POA: Insufficient documentation

## 2020-06-08 DIAGNOSIS — E119 Type 2 diabetes mellitus without complications: Secondary | ICD-10-CM

## 2020-06-08 DIAGNOSIS — T383X5D Adverse effect of insulin and oral hypoglycemic [antidiabetic] drugs, subsequent encounter: Secondary | ICD-10-CM

## 2020-06-08 DIAGNOSIS — Z79899 Other long term (current) drug therapy: Secondary | ICD-10-CM

## 2020-06-08 DIAGNOSIS — E785 Hyperlipidemia, unspecified: Secondary | ICD-10-CM

## 2020-06-08 DIAGNOSIS — Z9849 Cataract extraction status, unspecified eye: Secondary | ICD-10-CM

## 2020-06-08 NOTE — Progress Notes (Signed)
   Subjective:    Patient ID: Aimee Sharp, female    DOB: 1956/03/23, 65 y.o.   MRN: [id number]  HPI She is here for complete examination.  She is seen fairly regularly for her diabetes.  Review of the record indicates she needs a Pap as well as mammogram.  She has a history of cataract extraction a few years ago.  She continues on Lipitor and having no difficulty with that.  She is also taking Jardiance and semaglutide for her diabetes and having no difficulty with that.  She continues on her blood pressure medication.  She has no particular concerns or complaints.  She had a colonoscopy in 2019 which was negative.   Review of Systems     Objective:   Physical Exam Alert and in no distress. Tympanic membranes and canals are normal. Pharyngeal area is normal. Neck is supple without adenopathy or thyromegaly. Cardiac exam shows a regular sinus rhythm without murmurs or gallops. Lungs are clear to auscultation. Abdominal exam shows no masses or tenderness.  Pelvic exam was negative.  Pap smear taken.       Assessment & Plan:  Routine general medical examination at a health care facility  Type 2 diabetes mellitus without complication, without long-term current use of insulin (HCC)  Hypertension associated with diabetes (HCC)  Hyperlipidemia associated with type 2 diabetes mellitus (HCC)  Encounter for long-term (current) use of medications  Adverse effect of metformin, subsequent encounter  Screening for cervical cancer - Plan: Cytology - PAP([facility])  History of cataract extraction, unspecified laterality  She will continue on her present medication regimen.  Her last hemoglobin A1c was 7.3 which is pretty good.

## 2020-06-09 ENCOUNTER — Ambulatory Visit
Admission: RE | Admit: 2020-06-09 | Discharge: 2020-06-09 | Disposition: A | Discharge status: Home / Self Care | Payer: 59 | Source: Ambulatory Visit | Attending: Family Medicine | Admitting: Family Medicine

## 2020-06-13 LAB — CYTOLOGY - PAP: Diagnosis: NEGATIVE

## 2020-07-27 DIAGNOSIS — H52203 Unspecified astigmatism, bilateral: Secondary | ICD-10-CM | POA: Diagnosis not present

## 2020-07-27 DIAGNOSIS — E119 Type 2 diabetes mellitus without complications: Secondary | ICD-10-CM | POA: Diagnosis not present

## 2020-07-27 DIAGNOSIS — Z961 Presence of intraocular lens: Secondary | ICD-10-CM | POA: Diagnosis not present

## 2020-07-27 DIAGNOSIS — H524 Presbyopia: Secondary | ICD-10-CM | POA: Diagnosis not present

## 2020-07-27 DIAGNOSIS — H5213 Myopia, bilateral: Secondary | ICD-10-CM | POA: Diagnosis not present

## 2020-07-27 LAB — HM DIABETES EYE EXAM

## 2020-08-05 ENCOUNTER — Ambulatory Visit (INDEPENDENT_AMBULATORY_CARE_PROVIDER_SITE_OTHER): Payer: HMO | Admitting: Family Medicine

## 2020-08-05 ENCOUNTER — Other Ambulatory Visit: Payer: Self-pay

## 2020-08-05 ENCOUNTER — Encounter: Payer: Self-pay | Admitting: Family Medicine

## 2020-08-05 VITALS — BP 128/88 | HR 70 | Temp 96.2°F | Wt 149.8 lb

## 2020-08-05 DIAGNOSIS — E785 Hyperlipidemia, unspecified: Secondary | ICD-10-CM | POA: Diagnosis not present

## 2020-08-05 DIAGNOSIS — I152 Hypertension secondary to endocrine disorders: Secondary | ICD-10-CM

## 2020-08-05 DIAGNOSIS — E1159 Type 2 diabetes mellitus with other circulatory complications: Secondary | ICD-10-CM

## 2020-08-05 DIAGNOSIS — E1169 Type 2 diabetes mellitus with other specified complication: Secondary | ICD-10-CM

## 2020-08-05 DIAGNOSIS — E119 Type 2 diabetes mellitus without complications: Secondary | ICD-10-CM

## 2020-08-05 DIAGNOSIS — T383X5D Adverse effect of insulin and oral hypoglycemic [antidiabetic] drugs, subsequent encounter: Secondary | ICD-10-CM

## 2020-08-05 LAB — POCT GLYCOSYLATED HEMOGLOBIN (HGB A1C): Hemoglobin A1C: 7.4 % — AB (ref 4.0–5.6)

## 2020-08-05 NOTE — Progress Notes (Signed)
  Subjective:    Patient ID: Aimee Sharp, female    DOB: 1956/02/23, 65 y.o.   MRN: [id number]  Aimee Sharp is a 65 y.o. female who presents for follow-up of Type 2 diabetes mellitus.  Home blood sugar records: not checking Current symptoms/problems include none at this time. Daily foot checks: yes   Any foot concerns:none Exercise: walking, dancing Diet: fair She continues on atorvastatin, Jardiance, Ozempic, irbesartan/HCTZ. She is having no difficulty with any of these medications. She keeps himself physically active. Does not smoke. The following portions of the patient's history were reviewed and updated as appropriate: allergies, current medications, past medical history, past social history and problem list.  ROS as in subjective above.     Objective:    Physical Exam Alert and in no distress otherwise not examined.  Hemoglobin A1c is 7.4 Lab Review Diabetic Labs Latest Ref Rng & Units 04/22/2020 10/14/2019 06/17/2019 02/12/2019 08/05/2018  HbA1c 4.0 - 5.6 % 7.4(A) 7.0(A) 7.3(A) 5.8(A) 8.3(A)  Microalbumin mg/L - 24.4 - - -  Micro/Creat Ratio - - 20.7 - - -  Chol 100 - 199 mg/dL 147 - - 137 -  HDL >39 mg/dL 47 - - 41 -  Calc LDL 0 - 99 mg/dL 80 - - 80 -  Triglycerides 0 - 149 mg/dL 109 - - 85 -  Creatinine 0.57 - 1.00 mg/dL 0.94 - - 0.80 -   BP/Weight 06/08/2020 04/22/2020 10/14/2019 06/17/2019 07/06/5425  Systolic BP 062 376 283 151 761  Diastolic BP 86 84 86 84 76  Wt. (Lbs) 148.6 149.2 146.6 146.2 149.8  BMI 27.18 27.29 26.81 26.74 27.4   Foot/eye exam completion dates Latest Ref Rng & Units 07/27/2020 04/22/2020  Eye Exam No Retinopathy No Retinopathy -  Foot Form Completion - - Done    Aimee Sharp  reports that she has never smoked. She has never used smokeless tobacco. She reports that she does not drink alcohol and does not use drugs.     Assessment & Plan:    Type 2 diabetes mellitus without complication, without long-term current use of insulin (HCC) - Plan: POCT  glycosylated hemoglobin (Hb A1C)  Adverse effect of metformin, subsequent encounter  Hypertension associated with diabetes ([place])  Hyperlipidemia associated with type 2 diabetes mellitus ([place])   1. Rx changes: none 2. Education: Reviewed 'ABCs' of diabetes management (respective goals in parentheses):  A1C (<7), blood pressure (<130/80), and cholesterol (LDL <100). 3. Compliance at present is estimated to be good. Efforts to improve compliance (if necessary) will be directed at Continue present lifestyle. 4. Follow up: 6 months Encouraged her to periodically check her blood sugars either before a meal or 2 2 hours after she eats to ensure that the diabetes blood sugars do not creep up on a clear

## 2020-08-09 ENCOUNTER — Encounter: Payer: Self-pay | Admitting: Family Medicine

## 2020-08-18 ENCOUNTER — Telehealth: Payer: Self-pay | Admitting: Family Medicine

## 2020-08-18 NOTE — Telephone Encounter (Signed)
Pt called and states that her insurance has changed. She needs one of her meds sent to [facility]. Please send atorvastatin to [facility] on [address]. Pt can be reached at [phone].

## 2020-08-19 ENCOUNTER — Other Ambulatory Visit: Payer: Self-pay

## 2020-08-19 DIAGNOSIS — E1169 Type 2 diabetes mellitus with other specified complication: Secondary | ICD-10-CM

## 2020-08-19 DIAGNOSIS — E785 Hyperlipidemia, unspecified: Secondary | ICD-10-CM

## 2020-08-19 MED ORDER — ATORVASTATIN CALCIUM 20 MG PO TABS: 20.0000 mg | ORAL_TABLET | Freq: Every day | ORAL | 0 refills | Status: DC

## 2020-08-19 NOTE — Telephone Encounter (Signed)
Done KH 

## 2020-11-02 ENCOUNTER — Other Ambulatory Visit: Payer: Self-pay | Admitting: Family Medicine

## 2020-11-02 DIAGNOSIS — E1169 Type 2 diabetes mellitus with other specified complication: Secondary | ICD-10-CM

## 2020-12-09 ENCOUNTER — Telehealth: Payer: Self-pay

## 2020-12-09 NOTE — Telephone Encounter (Signed)
A RN from [facility] called stating that the pt. Was in the gap of coverage for her Jardiance and Ozempic she still has some to last for a little while. But one was going to cost 300 and the other 100 dollars and she can not afford that. You may want to change medications.

## 2020-12-20 ENCOUNTER — Ambulatory Visit (INDEPENDENT_AMBULATORY_CARE_PROVIDER_SITE_OTHER): Payer: HMO

## 2020-12-20 DIAGNOSIS — Z23 Encounter for immunization: Secondary | ICD-10-CM

## 2021-01-01 ENCOUNTER — Telehealth: Payer: Self-pay

## 2021-01-01 NOTE — Telephone Encounter (Signed)
PT ASSISTANCE application OZEMPIC

## 2021-01-01 NOTE — Telephone Encounter (Signed)
PT ASSISTANCE application JARDIANCE

## 2021-03-02 ENCOUNTER — Telehealth: Payer: Self-pay

## 2021-03-02 NOTE — Telephone Encounter (Signed)
ERROR

## 2021-03-02 NOTE — Telephone Encounter (Signed)
Received response back that need proof of income needed and a bank statement can't be used,  need SSA letter.  Called pt and informed, also samples given to hold pt.

## 2021-03-06 ENCOUNTER — Other Ambulatory Visit: Payer: Self-pay

## 2021-03-06 ENCOUNTER — Ambulatory Visit (INDEPENDENT_AMBULATORY_CARE_PROVIDER_SITE_OTHER): Payer: HMO | Admitting: Family Medicine

## 2021-03-06 VITALS — BP 124/80 | HR 73 | Temp 97.0°F | Wt 137.2 lb

## 2021-03-06 DIAGNOSIS — I152 Hypertension secondary to endocrine disorders: Secondary | ICD-10-CM

## 2021-03-06 DIAGNOSIS — Z79899 Other long term (current) drug therapy: Secondary | ICD-10-CM

## 2021-03-06 DIAGNOSIS — T383X5D Adverse effect of insulin and oral hypoglycemic [antidiabetic] drugs, subsequent encounter: Secondary | ICD-10-CM | POA: Diagnosis not present

## 2021-03-06 DIAGNOSIS — E1159 Type 2 diabetes mellitus with other circulatory complications: Secondary | ICD-10-CM | POA: Diagnosis not present

## 2021-03-06 DIAGNOSIS — Z23 Encounter for immunization: Secondary | ICD-10-CM

## 2021-03-06 DIAGNOSIS — E785 Hyperlipidemia, unspecified: Secondary | ICD-10-CM

## 2021-03-06 DIAGNOSIS — E1169 Type 2 diabetes mellitus with other specified complication: Secondary | ICD-10-CM | POA: Diagnosis not present

## 2021-03-06 LAB — POCT GLYCOSYLATED HEMOGLOBIN (HGB A1C): Hemoglobin A1C: 6.7 % — AB (ref 4.0–5.6)

## 2021-03-06 NOTE — Progress Notes (Signed)
  Subjective:    Patient ID: Aimee Sharp, female    DOB: [birth year], 65 y.o.   MRN: [id number]  Aimee Sharp is a 65 y.o. female who presents for follow-up of Type 2 diabetes mellitus.  She also recently broke some of her teeth and subsequently had to have dental procedures done.  She was forced to eat soft foods and has lost some weight because of that.  She is metformin intolerant. Home blood sugar records:  fasting and post meal lowest reading 129 highest reading 187 Current symptoms/problems include none and have been unchanged. Daily foot checks:yes  Any foot concerns: right leg and foot pain Exercise: Home exercise routine includes walking four hrs per day. Diet: fair She continues on Jardiance as well as Ozempic but is having difficulty with the cost.  She continues on Lipitor without difficulty.  Aimee Sharp is causing no trouble. The following portions of the patient's history were reviewed and updated as appropriate: allergies, current medications, past medical history, past social history and problem list.  ROS as in subjective above.     Objective:    Physical Exam Alert and in no distress otherwise not examined. Hemoglobin A1c is 6.7 Lab Review Diabetic Labs Latest Ref Rng & Units 08/05/2020 04/22/2020 10/14/2019 06/17/2019 02/12/2019  HbA1c 4.0 - 5.6 % 7.4(A) 7.4(A) 7.0(A) 7.3(A) 5.8(A)  Microalbumin mg/L - - 24.4 - -  Micro/Creat Ratio - - - 20.7 - -  Chol 100 - 199 mg/dL - 147 - - 137  HDL >39 mg/dL - 47 - - 41  Calc LDL 0 - 99 mg/dL - 80 - - 80  Triglycerides 0 - 149 mg/dL - 109 - - 85  Creatinine 0.57 - 1.00 mg/dL - 0.94 - - 0.80   BP/Weight 08/05/2020 06/08/2020 04/22/2020 10/14/2019 08/02/6008  Systolic BP 932 355 732 202 542  Diastolic BP 88 86 84 86 84  Wt. (Lbs) 149.8 148.6 149.2 146.6 146.2  BMI 27.4 27.18 27.29 26.81 26.74   Foot/eye exam completion dates Latest Ref Rng & Units 07/27/2020 04/22/2020  Eye Exam No Retinopathy No Retinopathy -  Foot Form Completion - -  Done    Aimee Sharp  reports that she has never smoked. She has never used smokeless tobacco. She reports that she does not drink alcohol and does not use drugs.     Assessment & Plan:    Type 2 diabetes mellitus with other specified complication, without long-term current use of insulin (HCC) - Plan: POCT glycosylated hemoglobin (Hb A1C)  Hypertension associated with diabetes ([place])  Hyperlipidemia associated with type 2 diabetes mellitus ([place])  Encounter for long-term (current) use of medications  Adverse effect of metformin, subsequent encounter  Need for influenza vaccination - Plan: Flu vaccine HIGH DOSE PF (Fluzone High dose)  Rx changes: none Education: Reviewed 'ABCs' of diabetes management (respective goals in parentheses):  A1C (<7), blood pressure (<130/80), and cholesterol (LDL <100). Compliance at present is estimated to be good. Efforts to improve compliance (if necessary) will be directed at  continue to monitor eating habits now that she has a full set of teeth. . Follow up: 4 months Cautioned to to not let the fact that she has a full set of teeth caused her to eat more than she should.  Encouraged her to try and maintain her weight.

## 2021-03-17 ENCOUNTER — Other Ambulatory Visit: Payer: Self-pay | Admitting: Family Medicine

## 2021-03-17 DIAGNOSIS — E1169 Type 2 diabetes mellitus with other specified complication: Secondary | ICD-10-CM

## 2021-03-31 ENCOUNTER — Other Ambulatory Visit: Payer: Self-pay | Admitting: Family Medicine

## 2021-03-31 DIAGNOSIS — E1169 Type 2 diabetes mellitus with other specified complication: Secondary | ICD-10-CM

## 2021-03-31 DIAGNOSIS — E785 Hyperlipidemia, unspecified: Secondary | ICD-10-CM

## 2021-05-04 ENCOUNTER — Other Ambulatory Visit: Payer: Self-pay | Admitting: Family Medicine

## 2021-05-04 DIAGNOSIS — E1159 Type 2 diabetes mellitus with other circulatory complications: Secondary | ICD-10-CM

## 2021-05-10 ENCOUNTER — Telehealth: Payer: Self-pay

## 2021-05-10 DIAGNOSIS — E119 Type 2 diabetes mellitus without complications: Secondary | ICD-10-CM

## 2021-05-10 NOTE — Telephone Encounter (Signed)
[place] and there was error in processing and they are going to reprocess but since it's so late it is going to be for 2023, so soonest will get is January.  Sample given to hold pt

## 2021-05-16 ENCOUNTER — Other Ambulatory Visit: Payer: Self-pay | Admitting: Family Medicine

## 2021-05-16 DIAGNOSIS — E119 Type 2 diabetes mellitus without complications: Secondary | ICD-10-CM

## 2021-06-03 ENCOUNTER — Other Ambulatory Visit: Payer: Self-pay | Admitting: Family Medicine

## 2021-06-03 DIAGNOSIS — E1159 Type 2 diabetes mellitus with other circulatory complications: Secondary | ICD-10-CM

## 2021-06-03 DIAGNOSIS — I152 Hypertension secondary to endocrine disorders: Secondary | ICD-10-CM

## 2021-06-09 ENCOUNTER — Other Ambulatory Visit: Payer: Self-pay

## 2021-06-09 ENCOUNTER — Ambulatory Visit (INDEPENDENT_AMBULATORY_CARE_PROVIDER_SITE_OTHER): Payer: HMO | Admitting: Family Medicine

## 2021-06-09 ENCOUNTER — Encounter: Payer: Self-pay | Admitting: Family Medicine

## 2021-06-09 VITALS — BP 126/80 | HR 76 | Temp 96.6°F | Ht 61.0 in | Wt 135.6 lb

## 2021-06-09 DIAGNOSIS — Z0000 Encounter for general adult medical examination without abnormal findings: Secondary | ICD-10-CM

## 2021-06-09 DIAGNOSIS — T383X5D Adverse effect of insulin and oral hypoglycemic [antidiabetic] drugs, subsequent encounter: Secondary | ICD-10-CM

## 2021-06-09 DIAGNOSIS — Z23 Encounter for immunization: Secondary | ICD-10-CM | POA: Diagnosis not present

## 2021-06-09 DIAGNOSIS — E1169 Type 2 diabetes mellitus with other specified complication: Secondary | ICD-10-CM | POA: Diagnosis not present

## 2021-06-09 DIAGNOSIS — Z79899 Other long term (current) drug therapy: Secondary | ICD-10-CM | POA: Diagnosis not present

## 2021-06-09 DIAGNOSIS — E785 Hyperlipidemia, unspecified: Secondary | ICD-10-CM

## 2021-06-09 DIAGNOSIS — Z1159 Encounter for screening for other viral diseases: Secondary | ICD-10-CM | POA: Diagnosis not present

## 2021-06-09 DIAGNOSIS — I152 Hypertension secondary to endocrine disorders: Secondary | ICD-10-CM

## 2021-06-09 DIAGNOSIS — Z13820 Encounter for screening for osteoporosis: Secondary | ICD-10-CM | POA: Diagnosis not present

## 2021-06-09 DIAGNOSIS — E1159 Type 2 diabetes mellitus with other circulatory complications: Secondary | ICD-10-CM | POA: Diagnosis not present

## 2021-06-09 LAB — POCT GLYCOSYLATED HEMOGLOBIN (HGB A1C): Hemoglobin A1C: 6.7 % — AB (ref 4.0–5.6)

## 2021-06-09 MED ORDER — ATORVASTATIN CALCIUM 20 MG PO TABS: 20.0000 mg | ORAL_TABLET | Freq: Every day | ORAL | 3 refills | Status: DC

## 2021-06-09 MED ORDER — IRBESARTAN-HYDROCHLOROTHIAZIDE 150-12.5 MG PO TABS: 1.0000 | ORAL_TABLET | Freq: Every day | ORAL | 3 refills | Status: DC

## 2021-06-09 NOTE — Progress Notes (Signed)
Aimee Sharp is a 66 y.o. female who presents for annual wellness visit and follow-up on chronic medical conditions.  She is now retired and enjoying her retirement.  She does have underlying diabetes but does have metformin intolerance.  The cost of her diabetes medicines is becoming a concern and we are working with her to try and get coverage for the Jardiance as well as the [place].  She does check her blood sugars fairly regularly.  Continues on Avalide as well as atorvastatin and having no difficulty with them.  She does keep her self active.  Does not smoke or drink.  Immunizations and Health Maintenance Immunization History  Administered Date(s) Administered   Fluad Quad(high Dose 65+) 03/06/2021   Influenza Split 06/18/2011   Influenza,inj,Quad PF,6+ Mos 02/20/2013, 06/17/2014, 02/18/2015, 02/06/2017, 02/07/2018, 02/12/2019, 04/22/2020   PFIZER Comirnaty(Gray Top)Covid-19 Tri-Sucrose Vaccine 12/20/2020   PFIZER(Purple Top)SARS-COV-2 Vaccination 08/14/2019, 09/04/2019, 02/24/2020   Pneumococcal Conjugate-13 02/06/2017   Pneumococcal Polysaccharide-23 03/25/2015   Tdap 02/18/2015   Zoster Recombinat (Shingrix) 10/22/2017, 02/07/2018   Zoster, Live 08/12/2015   Health Maintenance Due  Topic Date Due   Hepatitis C Screening  Never done   DEXA SCAN  07/28/2020   COVID-19 Vaccine (5 - Booster for Pfizer series) 02/14/2021    Last Pap smear: aged out  Last mammogram: 06/09/20 Last colonoscopy: 02/28/18 Dr. Michail Sermon Last DEXA: 02/19/2001 Dentist: Q year Ophtho: Q two years Exercise: walking QD  Other doctors caring for patient include:Dr. Michail Sermon GI             Advanced directives: Does Patient Have a Medical Advance Directive?: No Would patient like information on creating a medical advance directive?: Yes (ED - Information included in AVS) Information given Depression screen:  See questionnaire below.  Depression screen [facility] 2/9 06/09/2021 06/08/2020 04/22/2020 02/07/2018 09/13/2016   Decreased Interest 0 0 0 0 0  Down, Depressed, Hopeless 0 0 0 0 0  PHQ - 2 Score 0 0 0 0 0    Fall Risk Screen: see questionnaire below. Fall Risk  06/09/2021 03/06/2021 09/13/2016 08/14/2015 02/18/2015  Falls in the past year? 1 1 No No No  Number falls in past yr: 0 0 - - -  Injury with Fall? 1 0 - - -  Risk for fall due to : Impaired balance/gait No Fall Risks - - -  Follow up Falls evaluation completed - - - -    ADL screen:  See questionnaire below Functional Status Survey: Is the patient deaf or have difficulty hearing?: No Does the patient have difficulty seeing, even when wearing glasses/contacts?: No Does the patient have difficulty concentrating, remembering, or making decisions?: No Does the patient have difficulty walking or climbing stairs?: No Does the patient have difficulty dressing or bathing?: No Does the patient have difficulty doing errands alone such as visiting a doctor's office or shopping?: No   Review of Systems Constitutional: -, -unexpected weight change, -anorexia, -fatigue Allergy: -sneezing, -itching, -congestion Dermatology: denies changing moles, rash, lumps ENT: -runny nose, -ear pain, -sore throat,  Cardiology:  -chest pain, -palpitations, -orthopnea, Respiratory: -cough, -shortness of breath, -dyspnea on exertion, -wheezing,  Gastroenterology: -abdominal pain, -nausea, -vomiting, -diarrhea, -constipation, -dysphagia Hematology: -bleeding or bruising problems Musculoskeletal: -arthralgias, -myalgias, -joint swelling, -back pain, - Ophthalmology: -vision changes,  Urology: -dysuria, -difficulty urinating,  -urinary frequency, -urgency, incontinence Neurology: -, -numbness, , -memory loss, -falls, -dizziness    PHYSICAL EXAM:  General Appearance: Alert, cooperative, no distress, appears stated age Head: Normocephalic, without obvious abnormality,  atraumatic Eyes: PERRL, conjunctiva/corneas clear, EOM's intact,  Ears: Normal TM's and external  ear canals Nose: Nares normal, mucosa normal, no drainage or sinus tenderness Throat: Lips, mucosa, and tongue normal; teeth and gums normal Neck: Supple, no lymphadenopathy;  thyroid:  no enlargement/tenderness/nodules; no carotid bruit or JVD Lungs: Clear to auscultation bilaterally without wheezes, rales or ronchi; respirations unlabored Heart: Regular rate and rhythm, S1 and S2 normal, no murmur, rubor gallop Abdomen: Soft, non-tender, nondistended, normoactive bowel sounds,  no masses, no hepatosplenomegaly Extremities: No clubbing, cyanosis or edema diabetic foot exam is normal. Pulses: 2+ and symmetric all extremities Skin:  Skin color, texture, turgor normal, no rashes or lesions Lymph nodes: Cervical, supraclavicular, and axillary nodes normal Neurologic:  CNII-XII intact, normal strength, sensation and gait; reflexes 2+ and symmetric throughout Psych: Normal mood, affect, hygiene and grooming. Hemoglobin A1c is 6.7 ASSESSMENT/PLAN: Routine general medical examination at a health care facility - Plan: CBC with Differential/Platelet, Comprehensive metabolic panel, Lipid panel  Hypertension associated with diabetes ([place]) - Plan: CBC with Differential/Platelet, Comprehensive metabolic panel, irbesartan-hydrochlorothiazide (AVALIDE) 150-12.5 MG tablet  Type 2 diabetes mellitus with other specified complication, without long-term current use of insulin (HCC) - Plan: CBC with Differential/Platelet, Comprehensive metabolic panel, Lipid panel, POCT glycosylated hemoglobin (Hb A1C)  Hyperlipidemia associated with type 2 diabetes mellitus (HCC) - Plan: atorvastatin (LIPITOR) 20 MG tablet  Encounter for long-term (current) use of medications - Plan: CBC with Differential/Platelet, Comprehensive metabolic panel, Hepatitis C antibody, Lipid panel, POCT glycosylated hemoglobin (Hb A1C)  Adverse effect of metformin, subsequent encounter  Need for COVID-19 vaccine - Plan: Engineer, production  Screening for osteoporosis - Plan: DG Bone Density  Need for hepatitis C screening test - Plan: Hepatitis C antibody I complemented her on keeping her diabetes under good control. Discussed  yearly mammograms versus every other year; at least 30 minutes of aerobic activity at least 5 days/week and weight-bearing exercise 2x/week; proper sunscreen use reviewed; healthy diet, including goals of calcium and vitamin D intake and alcohol recommendations (less than or equal to 1 drink/day) reviewed; regular seatbelt use; changing batteries in smoke detectors.  Immunization recommendations discussed.  Colonoscopy recommendations reviewed   Medicare Attestation I have personally reviewed: The patient's medical and social history Their use of alcohol, tobacco or illicit drugs Their current medications and supplements The patient's functional ability including ADLs,fall risks, home safety risks, cognitive, and hearing and visual impairment Diet and physical activities Evidence for depression or mood disorders  The patient's weight, height, and BMI have been recorded in the chart.  I have made referrals, counseling, and provided education to the patient based on review of the above and I have provided the patient with a written personalized care plan for preventive services.     Jill Alexanders, MD   06/09/2021

## 2021-06-10 LAB — CBC WITH DIFFERENTIAL/PLATELET
Basophils Absolute: 0 10*3/uL (ref 0.0–0.2)
Basos: 0 %
EOS (ABSOLUTE): 0.2 10*3/uL (ref 0.0–0.4)
Eos: 2 %
Hematocrit: 35.9 % (ref 34.0–46.6)
Hemoglobin: 11.8 g/dL (ref 11.1–15.9)
Immature Grans (Abs): 0 10*3/uL (ref 0.0–0.1)
Immature Granulocytes: 0 %
Lymphocytes Absolute: 3.1 10*3/uL (ref 0.7–3.1)
Lymphs: 41 %
MCH: 28.9 pg (ref 26.6–33.0)
MCHC: 32.9 g/dL (ref 31.5–35.7)
MCV: 88 fL (ref 79–97)
Monocytes Absolute: 0.5 10*3/uL (ref 0.1–0.9)
Monocytes: 7 %
Neutrophils Absolute: 3.8 10*3/uL (ref 1.4–7.0)
Neutrophils: 50 %
Platelets: 369 10*3/uL (ref 150–450)
RBC: 4.09 x10E6/uL (ref 3.77–5.28)
RDW: 12.4 % (ref 11.7–15.4)
WBC: 7.6 10*3/uL (ref 3.4–10.8)

## 2021-06-10 LAB — COMPREHENSIVE METABOLIC PANEL
ALT: 18 IU/L (ref 0–32)
AST: 16 IU/L (ref 0–40)
Albumin/Globulin Ratio: 1.4 (ref 1.2–2.2)
Albumin: 4.4 g/dL (ref 3.8–4.8)
Alkaline Phosphatase: 186 IU/L — ABNORMAL HIGH (ref 44–121)
BUN/Creatinine Ratio: 10 — ABNORMAL LOW (ref 12–28)
BUN: 8 mg/dL (ref 8–27)
Bilirubin Total: 0.4 mg/dL (ref 0.0–1.2)
CO2: 26 mmol/L (ref 20–29)
Calcium: 9.7 mg/dL (ref 8.7–10.3)
Chloride: 100 mmol/L (ref 96–106)
Creatinine, Ser: 0.8 mg/dL (ref 0.57–1.00)
Globulin, Total: 3.2 g/dL (ref 1.5–4.5)
Glucose: 99 mg/dL (ref 70–99)
Potassium: 3.5 mmol/L (ref 3.5–5.2)
Sodium: 142 mmol/L (ref 134–144)
Total Protein: 7.6 g/dL (ref 6.0–8.5)
eGFR: 82 mL/min/{1.73_m2} (ref >59)

## 2021-06-10 LAB — LIPID PANEL
Chol/HDL Ratio: 3.1 ratio (ref 0.0–4.4)
Cholesterol, Total: 108 mg/dL (ref 100–199)
HDL: 35 mg/dL — ABNORMAL LOW (ref >39)
LDL Chol Calc (NIH): 52 mg/dL (ref 0–99)
Triglycerides: 115 mg/dL (ref 0–149)
VLDL Cholesterol Cal: 21 mg/dL (ref 5–40)

## 2021-06-10 LAB — HEPATITIS C ANTIBODY: Hep C Virus Ab: <0.1 s/co ratio (ref 0.0–0.9)

## 2021-06-25 IMAGING — MG DIGITAL SCREENING BILAT W/ CAD
4 series · 4 of 4 positions shown · non-contrast
Comparison: Previous exam(s).

CLINICAL DATA: Screening.

EXAM:
DIGITAL SCREENING BILATERAL MAMMOGRAM WITH CAD

[R CC]
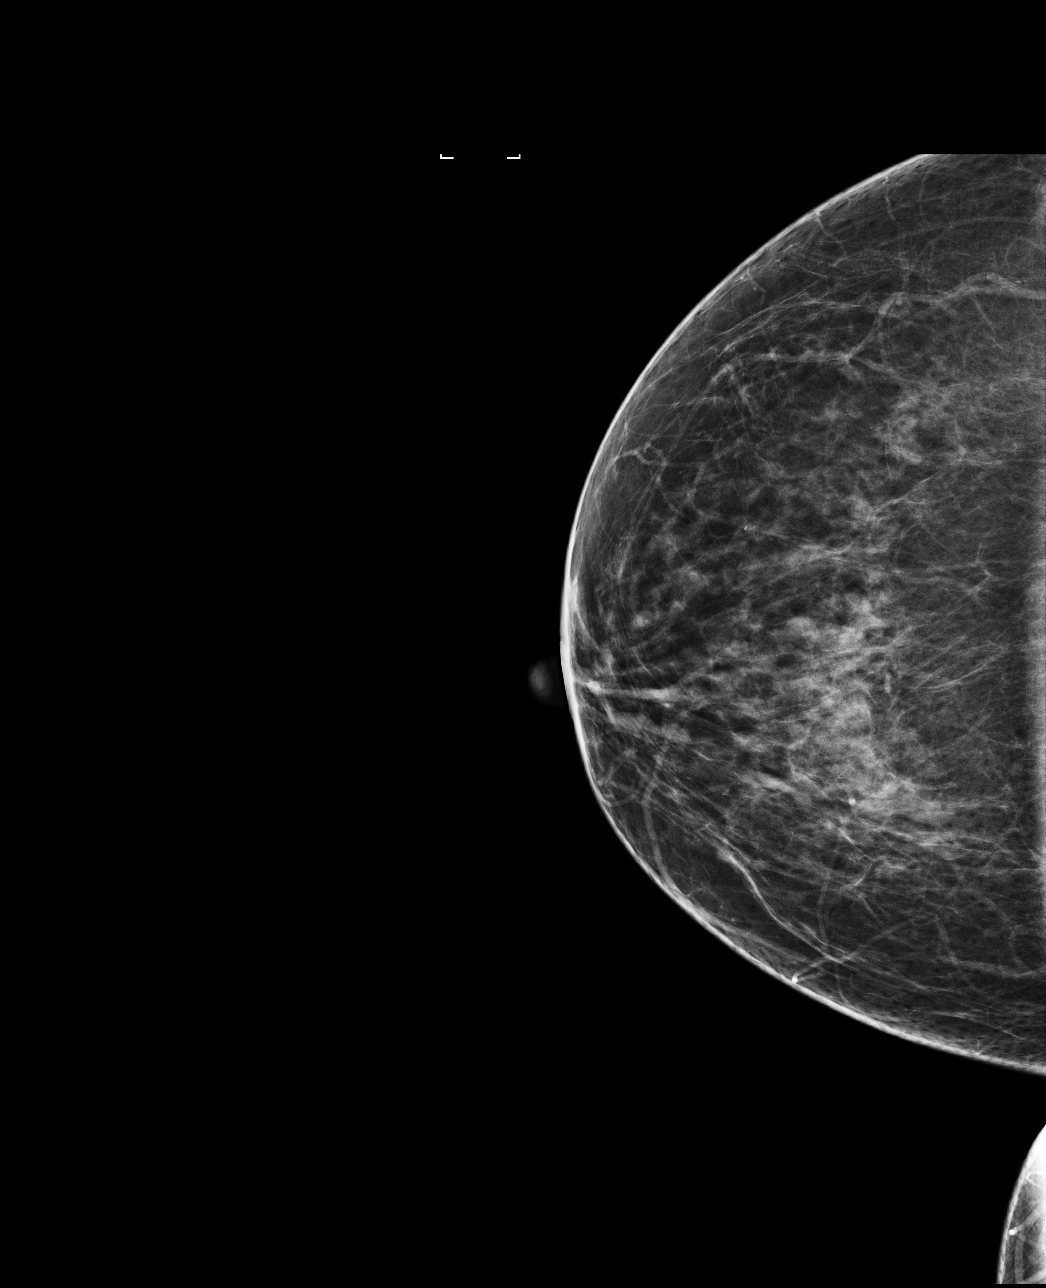

[L CC]
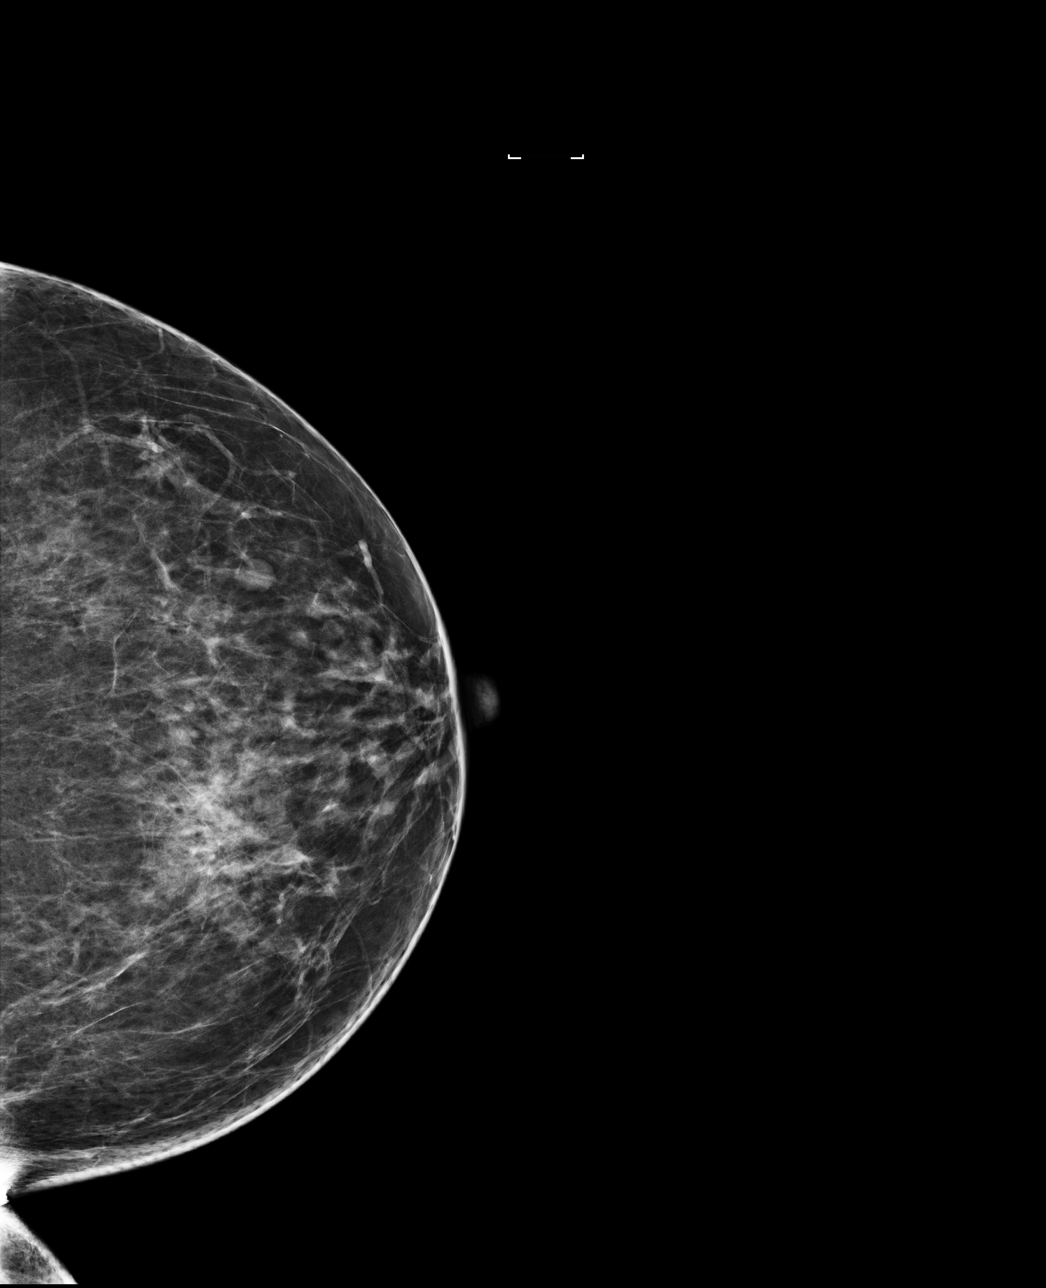

[L MLO]
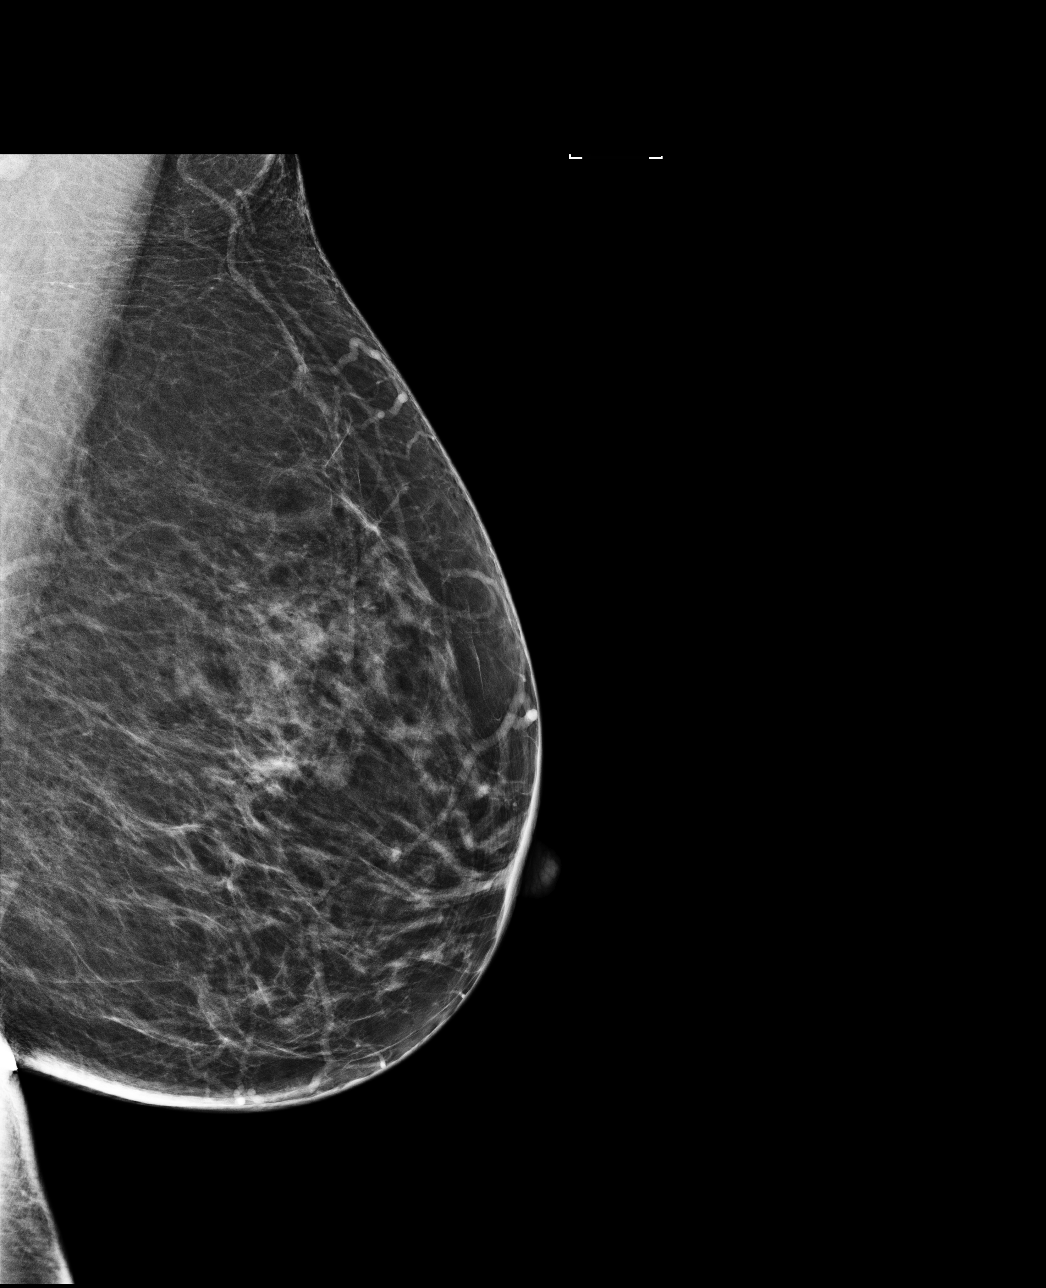

[R MLO]
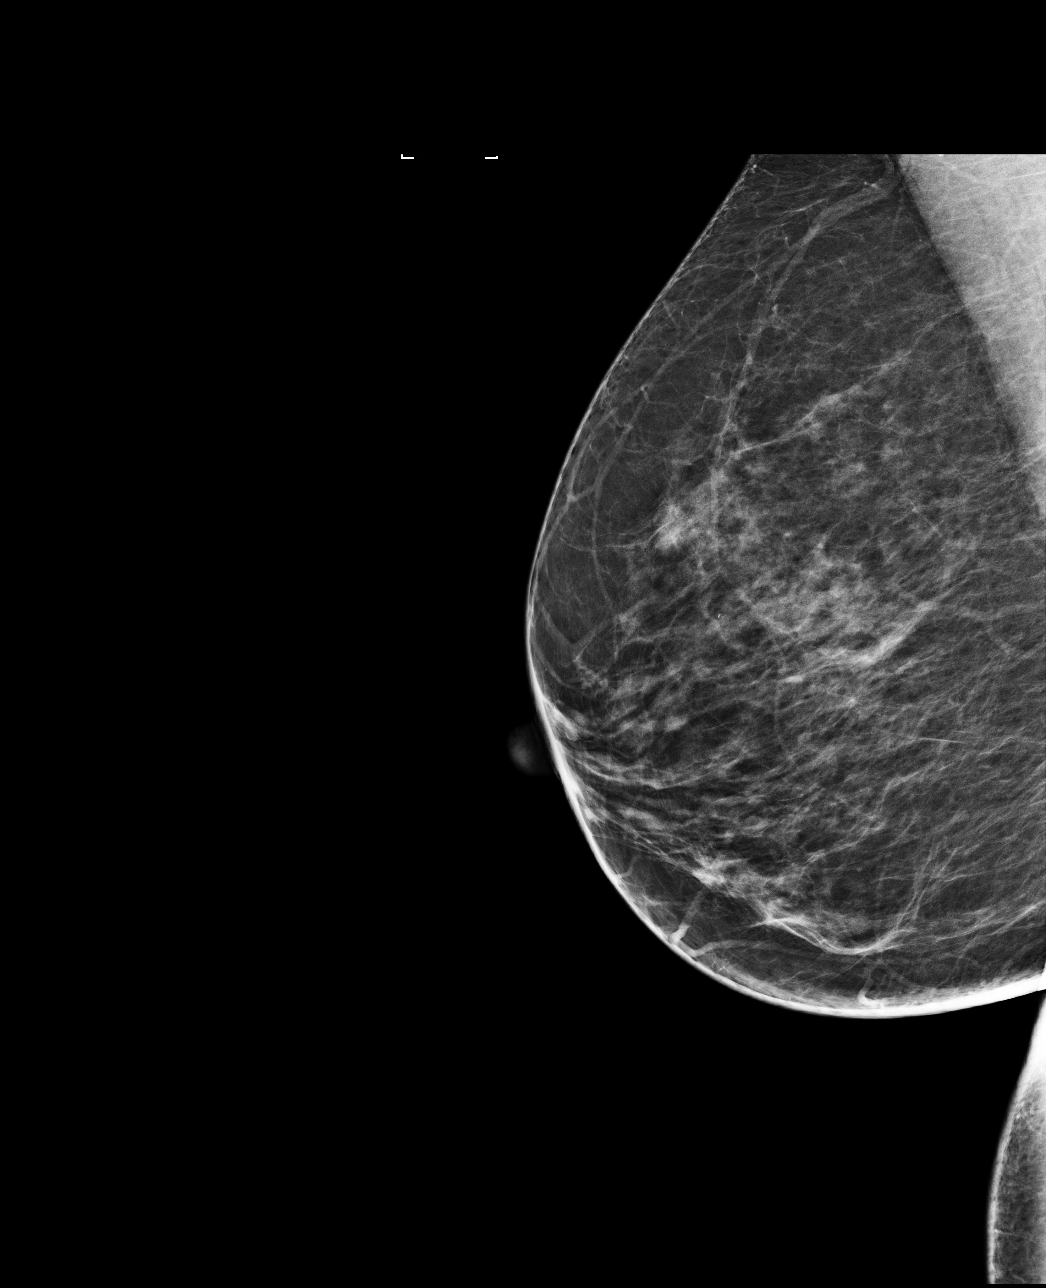

[4 of 4 positions shown; findings below may reference images not displayed]

ACR Breast Density Category b: There are scattered areas of
fibroglandular density.
FINDINGS: There are no findings suspicious for malignancy. Images were
processed with CAD.
IMPRESSION: No mammographic evidence of malignancy. A result letter of this
screening mammogram will be mailed directly to the patient.

RECOMMENDATION:
Screening mammogram in one year. (Code:AS-G-LCT)

BI-RADS CATEGORY  1: Negative.

## 2021-06-30 NOTE — Telephone Encounter (Signed)
Pt [place] approved til 06/03/2021 processing shipment

## 2021-07-21 NOTE — Telephone Encounter (Signed)
[place] t# [id number], Pt is approved til 06/03/22, takes about 30 days to ship.  Then asked for voucher and was approved for 30 day voucher sending to my email to be filled at local pharmacy

## 2021-08-01 ENCOUNTER — Other Ambulatory Visit: Payer: Self-pay | Admitting: Family Medicine

## 2021-08-01 DIAGNOSIS — Z961 Presence of intraocular lens: Secondary | ICD-10-CM | POA: Diagnosis not present

## 2021-08-01 DIAGNOSIS — E119 Type 2 diabetes mellitus without complications: Secondary | ICD-10-CM | POA: Diagnosis not present

## 2021-08-01 MED ORDER — OZEMPIC (0.25 OR 0.5 MG/DOSE) 2 MG/1.5ML ~~LOC~~ SOPN: PEN_INJECTOR | SUBCUTANEOUS | 3 refills | Status: DC

## 2021-08-01 NOTE — Telephone Encounter (Signed)
Called pt regarding Pt Assistance she got denial letter from [facility] and income statement and will bring by and she is out of Jardiance so gave her samples to hole her.

## 2021-08-01 NOTE — Telephone Encounter (Signed)
Called pt and informed, called in [place] went thru for $0 co pat, also Jardiance samples given to hold and she is bringing Fish farm manager income and LIS denial for [place]

## 2021-08-10 NOTE — Telephone Encounter (Signed)
Faxed new application income which also states pt doesn't qualify for LIS to [facility]

## 2021-08-12 NOTE — Telephone Encounter (Signed)
[employer] CARES PT ASSISTANCE approved til 06/03/22

## 2021-08-14 NOTE — Telephone Encounter (Signed)
Left message for pt

## 2021-08-15 NOTE — Telephone Encounter (Signed)
Pt Assistance Ozempic received left message for pt

## 2021-09-19 DIAGNOSIS — E1169 Type 2 diabetes mellitus with other specified complication: Secondary | ICD-10-CM | POA: Diagnosis not present

## 2021-09-19 DIAGNOSIS — I10 Essential (primary) hypertension: Secondary | ICD-10-CM | POA: Diagnosis not present

## 2021-09-19 DIAGNOSIS — E1142 Type 2 diabetes mellitus with diabetic polyneuropathy: Secondary | ICD-10-CM | POA: Diagnosis not present

## 2021-09-19 DIAGNOSIS — Z794 Long term (current) use of insulin: Secondary | ICD-10-CM | POA: Diagnosis not present

## 2021-09-19 DIAGNOSIS — E785 Hyperlipidemia, unspecified: Secondary | ICD-10-CM | POA: Diagnosis not present

## 2021-10-17 ENCOUNTER — Ambulatory Visit: Payer: HMO | Admitting: Family Medicine

## 2021-10-17 NOTE — Progress Notes (Deleted)
  Subjective:    Patient ID: Aimee Sharp, female    DOB: [birth year], 66 y.o.   MRN: [id number]  Aimee Sharp is a 66 y.o. female who presents for follow-up of Type 2 diabetes mellitus.  Patient {is/are not:32546} checking home blood sugars.   Home blood sugar records: {dm home sugars:14018} How often is blood sugars being checked: *** Current symptoms/problems include {Symptoms; diabetes:14075} and have been {Desc; course:15616}. Daily foot checks: ***   Any foot concerns: *** Last eye exam: *** Exercise: {types:19826}  The following portions of the patient's history were reviewed and updated as appropriate: allergies, current medications, past medical history, past social history and problem list.  ROS as in subjective above.     Objective:    Physical Exam Alert and in no distress otherwise not examined.  There were no vitals taken for this visit.  Lab Review    Latest Ref Rng & Units 06/09/2021    9:29 AM 06/09/2021    9:16 AM 03/06/2021   10:33 AM 08/05/2020   11:27 AM 04/22/2020   11:52 AM  Diabetic Labs  HbA1c 4.0 - 5.6 % 6.7    6.7   7.4   7.4    Chol 100 - 199 mg/dL  108       HDL >39 mg/dL  35       Calc LDL 0 - 99 mg/dL  52       Triglycerides 0 - 149 mg/dL  115       Creatinine 0.57 - 1.00 mg/dL  0.80           06/09/2021    8:41 AM 03/06/2021    8:40 AM 08/05/2020    9:15 AM 06/08/2020   10:23 AM 04/22/2020   10:58 AM  BP/Weight  Systolic BP 500 938 182 993 716  Diastolic BP 80 80 88 86 84  Wt. (Lbs) 135.6 137.2 149.8 148.6 149.2  BMI 25.62 kg/m2 25.09 kg/m2 27.4 kg/m2 27.18 kg/m2 27.29 kg/m2      Latest Ref Rng & Units 06/09/2021    8:30 AM 07/27/2020   12:00 AM  Foot/eye exam completion dates  Eye Exam No Retinopathy  No Retinopathy       Foot Form Completion  Done      This result is from an external source.    Yahaira  reports that she has never smoked. She has never used smokeless tobacco. She reports that she does not drink alcohol and does not use  drugs.     Assessment & Plan:    No diagnosis found.  Rx changes: {none:33079} Education: Reviewed 'ABCs' of diabetes management (respective goals in parentheses):  A1C (<7), blood pressure (<130/80), and cholesterol (LDL <100). Compliance at present is estimated to be {good/fair/poor:33178}. Efforts to improve compliance (if necessary) will be directed at {compliance:16716}. Follow up: {NUMBERS; 0-10:33138} {time:11}

## 2021-10-19 ENCOUNTER — Encounter: Payer: Self-pay | Admitting: Family Medicine

## 2021-11-15 ENCOUNTER — Other Ambulatory Visit: Payer: Self-pay | Admitting: Family Medicine

## 2021-11-15 DIAGNOSIS — E119 Type 2 diabetes mellitus without complications: Secondary | ICD-10-CM

## 2021-12-04 ENCOUNTER — Ambulatory Visit
Admission: RE | Admit: 2021-12-04 | Discharge: 2021-12-04 | Disposition: A | Discharge status: Home / Self Care | Payer: HMO | Source: Ambulatory Visit | Attending: Family Medicine | Admitting: Family Medicine

## 2021-12-04 DIAGNOSIS — Z780 Asymptomatic menopausal state: Secondary | ICD-10-CM | POA: Diagnosis not present

## 2021-12-08 ENCOUNTER — Ambulatory Visit (INDEPENDENT_AMBULATORY_CARE_PROVIDER_SITE_OTHER): Payer: HMO | Admitting: Family Medicine

## 2021-12-08 ENCOUNTER — Encounter: Payer: Self-pay | Admitting: Family Medicine

## 2021-12-08 ENCOUNTER — Telehealth: Payer: Self-pay | Admitting: Internal Medicine

## 2021-12-08 VITALS — BP 136/86 | HR 68 | Temp 96.8°F | Wt 137.4 lb

## 2021-12-08 DIAGNOSIS — I152 Hypertension secondary to endocrine disorders: Secondary | ICD-10-CM | POA: Diagnosis not present

## 2021-12-08 DIAGNOSIS — E785 Hyperlipidemia, unspecified: Secondary | ICD-10-CM | POA: Diagnosis not present

## 2021-12-08 DIAGNOSIS — E1159 Type 2 diabetes mellitus with other circulatory complications: Secondary | ICD-10-CM | POA: Diagnosis not present

## 2021-12-08 DIAGNOSIS — E1169 Type 2 diabetes mellitus with other specified complication: Secondary | ICD-10-CM | POA: Diagnosis not present

## 2021-12-08 DIAGNOSIS — T383X5D Adverse effect of insulin and oral hypoglycemic [antidiabetic] drugs, subsequent encounter: Secondary | ICD-10-CM | POA: Diagnosis not present

## 2021-12-08 LAB — POCT UA - MICROALBUMIN
Albumin/Creatinine Ratio, Urine, POC: 16.2
Creatinine, POC: 115.8 mg/dL
Microalbumin Ur, POC: 18.8 mg/L

## 2021-12-08 LAB — POCT GLYCOSYLATED HEMOGLOBIN (HGB A1C): Hemoglobin A1C: 7.3 % — AB (ref 4.0–5.6)

## 2021-12-08 MED ORDER — SEMAGLUTIDE (1 MG/DOSE) 4 MG/3ML ~~LOC~~ SOPN: 1.0000 mg | PEN_INJECTOR | SUBCUTANEOUS | 5 refills | Status: DC

## 2021-12-08 NOTE — Patient Instructions (Addendum)

## 2021-12-08 NOTE — Telephone Encounter (Signed)
P.A for ozempic. Waiting for response

## 2021-12-08 NOTE — Progress Notes (Signed)
Subjective:    Patient ID: Aimee Sharp, female    DOB: [birth year], 66 y.o.   MRN: [id number]  Aimee Sharp is a 66 y.o. female who presents for follow-up of Type 2 diabetes mellitus. Home blood sugar records:  once a week fasting post meal.  Current symptoms/problems include  pain in both feet  and have been unchanged. Daily foot checks: yes  Any foot concerns: pain in both feet at hs Exercise: Home exercise routine includes walking qd. Diet: good  She continues on Jardiance and 0.25 of Ozempic.  She is also taking atorvastatin as well as irbesartan/HCTZ and having no difficulty with that.  She does keep her self physically active.  Does not smoke.  She does complain of some difficulty with foot pain mainly on the right that usually occurs mainly in the evenings. The following portions of the patient's history were reviewed and updated as appropriate: allergies, current medications, past medical history, past social history and problem list.  ROS as in subjective above.     Objective:    Physical Exam Alert and in no distress otherwise not examined. Hemoglobin A1c is 7.3.  Lab Review    Latest Ref Rng & Units 06/09/2021    9:29 AM 06/09/2021    9:16 AM 03/06/2021   10:33 AM 08/05/2020   11:27 AM 04/22/2020   11:52 AM  Diabetic Labs  HbA1c 4.0 - 5.6 % 6.7   6.7  7.4  7.4   Chol 100 - 199 mg/dL  108      HDL >39 mg/dL  35      Calc LDL 0 - 99 mg/dL  52      Triglycerides 0 - 149 mg/dL  115      Creatinine 0.57 - 1.00 mg/dL  0.80          06/09/2021    8:41 AM 03/06/2021    8:40 AM 08/05/2020    9:15 AM 06/08/2020   10:23 AM 04/22/2020   10:58 AM  BP/Weight  Systolic BP 623 762 831 517 616  Diastolic BP 80 80 88 86 84  Wt. (Lbs) 135.6 137.2 149.8 148.6 149.2  BMI 25.62 kg/m2 25.09 kg/m2 27.4 kg/m2 27.18 kg/m2 27.29 kg/m2      Latest Ref Rng & Units 06/09/2021    8:30 AM 07/27/2020   12:00 AM  Foot/eye exam completion dates  Eye Exam No Retinopathy  No Retinopathy      Foot  Form Completion  Done      This result is from an external source.    Aimee Sharp  reports that she has never smoked. She has never used smokeless tobacco. She reports that she does not drink alcohol and does not use drugs.     Assessment & Plan:    Type 2 diabetes mellitus with other specified complication, without long-term current use of insulin (HCC) - Plan: POCT UA - Microalbumin, POCT glycosylated hemoglobin (Hb A1C), Semaglutide, 1 MG/DOSE, 4 MG/3ML SOPN  Hypertension associated with diabetes ([place])  Hyperlipidemia associated with type 2 diabetes mellitus (HCC)  Adverse effect of metformin, subsequent encounter  Rx changes:  Increase the Ozempic up to 1 mg.  Continue on her other medications. Education: Reviewed 'ABCs' of diabetes management (respective goals in parentheses):  A1C (<7), blood pressure (<130/80), and cholesterol (LDL <100). Compliance at present is estimated to be fair. Efforts to improve compliance (if necessary) will be directed at increased exercise. Follow up: 4 months

## 2021-12-11 NOTE — Telephone Encounter (Signed)
Ozempic was approved. Pt was notified to go pick up med at pharmacy. I have faxed approval to pharmacy

## 2021-12-19 ENCOUNTER — Telehealth: Payer: Self-pay | Admitting: Family Medicine

## 2021-12-19 NOTE — Telephone Encounter (Signed)
Pt Assistance here for pt to pick up, pt informed

## 2021-12-19 NOTE — Telephone Encounter (Signed)
Pt called and states Ozempic dosage was changed at last office visit and was sent to [facility].  Advised would send form to [employer] for dosage increase.

## 2021-12-23 LAB — GLUCOSE, POCT (MANUAL RESULT ENTRY): POC Glucose: 122 mg/dl — AB (ref 70–99)

## 2021-12-26 NOTE — Telephone Encounter (Signed)
Pt assistance Ozempic received, pt informed

## 2022-01-26 NOTE — Telephone Encounter (Signed)
Pt Assistance Ozempic received, left message for pt, sent mychart message

## 2022-02-07 ENCOUNTER — Encounter: Payer: Self-pay | Admitting: Internal Medicine

## 2022-02-08 ENCOUNTER — Telehealth: Payer: Self-pay | Admitting: Family Medicine

## 2022-02-08 NOTE — Telephone Encounter (Signed)
Pt called and is requesting a refill for jardiance please send to the [facility] ([facility]), [place]

## 2022-02-09 ENCOUNTER — Other Ambulatory Visit: Payer: Self-pay

## 2022-02-09 DIAGNOSIS — E119 Type 2 diabetes mellitus without complications: Secondary | ICD-10-CM

## 2022-02-09 MED ORDER — EMPAGLIFLOZIN 25 MG PO TABS: 25.0000 mg | ORAL_TABLET | Freq: Every day | ORAL | 0 refills | Status: DC

## 2022-02-09 NOTE — Telephone Encounter (Signed)
Called pt and she hasn't gotten her Pt Assistance Jardiance, called [facility] & they state pt has to call for refill.  She was confused and didn't realize she was getting this thru Pt Assistance like her Ozempic.  I reordered and need to call again in 12/23

## 2022-02-09 NOTE — Telephone Encounter (Signed)
Done KH 

## 2022-03-13 ENCOUNTER — Encounter: Payer: Self-pay | Admitting: Internal Medicine

## 2022-03-26 ENCOUNTER — Encounter: Payer: Self-pay | Admitting: Internal Medicine

## 2022-04-09 ENCOUNTER — Encounter: Payer: Self-pay | Admitting: Family Medicine

## 2022-04-09 ENCOUNTER — Ambulatory Visit (INDEPENDENT_AMBULATORY_CARE_PROVIDER_SITE_OTHER): Payer: HMO | Admitting: Family Medicine

## 2022-04-09 VITALS — BP 128/86 | HR 66 | Temp 98.3°F | Wt 134.2 lb

## 2022-04-09 DIAGNOSIS — T383X5D Adverse effect of insulin and oral hypoglycemic [antidiabetic] drugs, subsequent encounter: Secondary | ICD-10-CM | POA: Diagnosis not present

## 2022-04-09 DIAGNOSIS — I152 Hypertension secondary to endocrine disorders: Secondary | ICD-10-CM

## 2022-04-09 DIAGNOSIS — Z23 Encounter for immunization: Secondary | ICD-10-CM | POA: Diagnosis not present

## 2022-04-09 DIAGNOSIS — Z79899 Other long term (current) drug therapy: Secondary | ICD-10-CM | POA: Diagnosis not present

## 2022-04-09 DIAGNOSIS — E1159 Type 2 diabetes mellitus with other circulatory complications: Secondary | ICD-10-CM

## 2022-04-09 DIAGNOSIS — E1169 Type 2 diabetes mellitus with other specified complication: Secondary | ICD-10-CM

## 2022-04-09 DIAGNOSIS — E785 Hyperlipidemia, unspecified: Secondary | ICD-10-CM

## 2022-04-09 DIAGNOSIS — Z1231 Encounter for screening mammogram for malignant neoplasm of breast: Secondary | ICD-10-CM

## 2022-04-09 LAB — POCT GLYCOSYLATED HEMOGLOBIN (HGB A1C): Hemoglobin A1C: 6.8 % — AB (ref 4.0–5.6)

## 2022-04-09 NOTE — Progress Notes (Signed)
Subjective:    Patient ID: Aimee Sharp, female    DOB: 07/19/1955, 66 y.o.   MRN: [id number]  Aimee Sharp is a 66 y.o. female who presents for follow-up of Type 2 diabetes mellitus.  Patient is not checking home blood sugars.   Home blood sugar records: patient does not check sugars How often is blood sugars being checked: blood sugar machine broke has not been checking lately  Current symptoms/problems include  has some tingling in the feet and burning  and have been stable. Daily foot checks: yes   Any foot concerns: tingling and burning in feet  Last eye exam: 1/23 Dr. Katy Fitch  Exercise: Home exercise routine includes walking 1 hrs per day. She is now retired and enjoying her retirement.  She continues on Jardiance and Ozempic and having no difficulty with that but has not been checking her sugars stating her glucometer broke.  She continues on atorvastatin and is also on irbesartan/HCTZ and having no difficulty with that.  She has had a mammogram and the record indicates need for an eye exam. The following portions of the patient's history were reviewed and updated as appropriate: allergies, current medications, past medical history, past social history and problem list.  ROS as in subjective above.     Objective:    Physical Exam Alert and in no distress otherwise not examined.  Hemoglobin A1c is 6.8.  Blood pressure 128/86, pulse 66, temperature 98.3 F (36.8 C), weight 134 lb 3.2 oz (60.9 kg).  Lab Review    Latest Ref Rng & Units 12/08/2021   11:47 AM 12/08/2021   10:50 AM 06/09/2021    9:29 AM 06/09/2021    9:16 AM 03/06/2021   10:33 AM  Diabetic Labs  HbA1c 4.0 - 5.6 %  7.3  6.7   6.7   Microalbumin mg/L 18.8       Micro/Creat Ratio  16.2       Chol 100 - 199 mg/dL    108    HDL >39 mg/dL    35    Calc LDL 0 - 99 mg/dL    52    Triglycerides 0 - 149 mg/dL    115    Creatinine 0.57 - 1.00 mg/dL    0.80        04/09/2022   10:22 AM 12/23/2021    2:30 PM 12/08/2021    10:35 AM 06/09/2021    8:41 AM 03/06/2021    8:40 AM  BP/Weight  Systolic BP 397 673 419 379 024  Diastolic BP 86 82 86 80 80  Wt. (Lbs) 134.2  137.4 135.6 137.2  BMI 25.36 kg/m2  25.96 kg/m2 25.62 kg/m2 25.09 kg/m2      Latest Ref Rng & Units 06/09/2021    8:30 AM 07/27/2020   12:00 AM  Foot/eye exam completion dates  Eye Exam No Retinopathy  No Retinopathy      Foot Form Completion  Done      This result is from an external source.    Aimee Sharp  reports that she has never smoked. She has never used smokeless tobacco. She reports that she does not drink alcohol and does not use drugs.     Assessment & Plan:    Type 2 diabetes mellitus with other specified complication, without long-term current use of insulin (HCC) - Plan: POCT glycosylated hemoglobin (Hb A1C)  Hypertension associated with diabetes ([place])  Encounter for long-term (current) use of medications  Adverse effect of metformin, subsequent encounter  Hyperlipidemia associated with type 2 diabetes mellitus ([place])  Need for COVID-19 vaccine - Plan: Ethel Fall 2023 Covid-19 Vaccine 84yr and older  Need for influenza vaccination - Plan: Flu vaccine HIGH DOSE PF (Fluzone High dose)  Encounter for screening mammogram for malignant neoplasm of breast - Plan: MM Digital Screening  I will get her a glucometer.  Continue on her present medications.  Her immunizations were updated.

## 2022-05-02 NOTE — Telephone Encounter (Signed)
Pt Assistance Ozempic received, pt informed  

## 2022-05-03 ENCOUNTER — Other Ambulatory Visit: Payer: HMO

## 2022-05-03 MED ORDER — GLUCOSE BLOOD VI STRP: ORAL_STRIP | 1 refills | Status: DC

## 2022-05-22 ENCOUNTER — Telehealth: Payer: Self-pay | Admitting: Family Medicine

## 2022-05-22 NOTE — Telephone Encounter (Signed)
Pt called concerning Jardiance. She states that she is out but she is supposed to get thru patient assistance. I told her it looks like she got thru [place] last time and she states that she was told by Mickel Baas she should not be paying for her meds. Please advise pt at [phone]. Once again pt is out of med.

## 2022-05-26 ENCOUNTER — Other Ambulatory Visit: Payer: Self-pay | Admitting: Family Medicine

## 2022-05-26 DIAGNOSIS — E1169 Type 2 diabetes mellitus with other specified complication: Secondary | ICD-10-CM

## 2022-06-04 ENCOUNTER — Telehealth: Payer: Self-pay | Admitting: Family Medicine

## 2022-06-04 NOTE — Telephone Encounter (Signed)
PT ASSISTANCE JARDIANCE RENEWAL 2024

## 2022-06-12 ENCOUNTER — Ambulatory Visit
Admission: RE | Admit: 2022-06-12 | Discharge: 2022-06-12 | Disposition: A | Discharge status: Home / Self Care | Payer: PPO | Source: Ambulatory Visit | Attending: Family Medicine | Admitting: Family Medicine

## 2022-06-12 DIAGNOSIS — Z1231 Encounter for screening mammogram for malignant neoplasm of breast: Secondary | ICD-10-CM | POA: Diagnosis not present

## 2022-06-14 ENCOUNTER — Telehealth: Payer: Self-pay | Admitting: Family Medicine

## 2022-06-14 ENCOUNTER — Encounter: Payer: Self-pay | Admitting: Family Medicine

## 2022-06-14 ENCOUNTER — Ambulatory Visit (INDEPENDENT_AMBULATORY_CARE_PROVIDER_SITE_OTHER): Payer: PPO | Admitting: Family Medicine

## 2022-06-14 VITALS — BP 130/86 | HR 75 | Temp 97.8°F | Resp 14 | Ht 61.75 in | Wt 131.6 lb

## 2022-06-14 DIAGNOSIS — E1159 Type 2 diabetes mellitus with other circulatory complications: Secondary | ICD-10-CM

## 2022-06-14 DIAGNOSIS — Z79899 Other long term (current) drug therapy: Secondary | ICD-10-CM | POA: Diagnosis not present

## 2022-06-14 DIAGNOSIS — I152 Hypertension secondary to endocrine disorders: Secondary | ICD-10-CM | POA: Diagnosis not present

## 2022-06-14 DIAGNOSIS — E1169 Type 2 diabetes mellitus with other specified complication: Secondary | ICD-10-CM

## 2022-06-14 DIAGNOSIS — E785 Hyperlipidemia, unspecified: Secondary | ICD-10-CM | POA: Diagnosis not present

## 2022-06-14 DIAGNOSIS — Z0000 Encounter for general adult medical examination without abnormal findings: Secondary | ICD-10-CM | POA: Diagnosis not present

## 2022-06-14 DIAGNOSIS — T383X5D Adverse effect of insulin and oral hypoglycemic [antidiabetic] drugs, subsequent encounter: Secondary | ICD-10-CM

## 2022-06-14 LAB — POCT GLYCOSYLATED HEMOGLOBIN (HGB A1C): Hemoglobin A1C: 7.3 % — AB (ref 4.0–5.6)

## 2022-06-14 MED ORDER — IRBESARTAN-HYDROCHLOROTHIAZIDE 150-12.5 MG PO TABS: 1.0000 | ORAL_TABLET | Freq: Every day | ORAL | 3 refills | Status: DC

## 2022-06-14 MED ORDER — SEMAGLUTIDE (1 MG/DOSE) 4 MG/3ML ~~LOC~~ SOPN: 1.0000 mg | PEN_INJECTOR | SUBCUTANEOUS | 5 refills | Status: DC

## 2022-06-14 MED ORDER — ATORVASTATIN CALCIUM 20 MG PO TABS: 20.0000 mg | ORAL_TABLET | Freq: Every day | ORAL | 1 refills | Status: DC

## 2022-06-14 MED ORDER — EMPAGLIFLOZIN 25 MG PO TABS: 25.0000 mg | ORAL_TABLET | Freq: Every day | ORAL | 0 refills | Status: DC

## 2022-06-14 NOTE — Telephone Encounter (Signed)
PT ASSISTANCE OZEMPIC RENEWAL

## 2022-06-14 NOTE — Progress Notes (Signed)
Aimee Sharp is a 67 y.o. female who presents for annual wellness visit and follow-up on chronic medical conditions.  She continues on Jardiance as well as Ozempic and is having no difficulty with these medications.  She does not necessarily check her blood sugars regularly.  She is also taking Lipitor without difficulty.  Continues on Avalide for her blood pressure.  She has had previous difficulty with metformin.  She is now retired and enjoying her retirement. Immunizations and Health Maintenance Immunization History  Administered Date(s) Administered   COVID-19, mRNA, vaccine(Comirnaty)12 years and older 04/09/2022   Fluad Quad(high Dose 65+) 03/06/2021, 04/09/2022   Influenza Split 06/18/2011   Influenza,inj,Quad PF,6+ Mos 02/20/2013, 06/17/2014, 02/18/2015, 02/06/2017, 02/07/2018, 02/12/2019, 04/22/2020   PFIZER Comirnaty(Gray Top)Covid-19 Tri-Sucrose Vaccine 12/20/2020   PFIZER(Purple Top)SARS-COV-2 Vaccination 08/14/2019, 09/04/2019, 02/24/2020   Pfizer Covid-19 Vaccine Bivalent Booster 73yr & up 06/09/2021   Pneumococcal Conjugate-13 02/06/2017   Pneumococcal Polysaccharide-23 03/25/2015   Tdap 02/18/2015   Zoster Recombinat (Shingrix) 10/22/2017, 02/07/2018   Zoster, Live 08/12/2015   Health Maintenance Due  Topic Date Due   OPHTHALMOLOGY EXAM  07/27/2021   Diabetic kidney evaluation - eGFR measurement  06/09/2022   FOOT EXAM  06/09/2022    Last Pap smear: 06/08/2020 Last mammogram: 06/12/2022 Last colonoscopy: 02/28/2018 Last DEXA: 12/04/2021 Dentist: has dentures Ophtho: more than I year ago Exercise: walking daily  Other doctors caring for patient include: No care team member to display   Advanced directives: yes    Depression screen:  See questionnaire below.     06/14/2022    8:22 AM 06/09/2021    8:33 AM 06/08/2020   10:22 AM 04/22/2020   10:49 AM 02/07/2018    8:35 AM  Depression screen PHQ 2/9  Decreased Interest 0 0 0 0 0  Down, Depressed, Hopeless 0 0 0  0 0  PHQ - 2 Score 0 0 0 0 0    Fall Risk Screen: see questionnaire below.    06/14/2022    8:22 AM 06/09/2021    8:33 AM 03/06/2021    8:33 AM 09/13/2016   11:30 AM 08/14/2015    4:09 PM  Fall Risk   Falls in the past year? 0 1 1 No No  Number falls in past yr: 0 0 0    Injury with Fall? 0 1 0    Risk for fall due to : No Fall Risks Impaired balance/gait No Fall Risks    Follow up Falls evaluation completed Falls evaluation completed       ADL screen:  See questionnaire below Functional Status Survey:  Neg    Review of Systems Constitutional: -, -unexpected weight change, -anorexia, -fatigue Allergy: -sneezing, -itching, -congestion Dermatology: denies changing moles, rash, lumps ENT: -runny nose, -ear pain, -sore throat,  Cardiology:  -chest pain, -palpitations, -orthopnea, Respiratory: -cough, -shortness of breath, -dyspnea on exertion, -wheezing,  Gastroenterology: -abdominal pain, -nausea, -vomiting, -diarrhea, -constipation, -dysphagia Hematology: -bleeding or bruising problems Musculoskeletal: -arthralgias, -myalgias, -joint swelling, -back pain, - Ophthalmology: -vision changes,  Urology: -dysuria, -difficulty urinating,  -urinary frequency, -urgency, incontinence Neurology: -, -numbness, , -memory loss, -falls, -dizziness    PHYSICAL EXAM:  BP 130/86   Pulse 75   Temp 97.8 F (36.6 C) (Oral)   Resp 14   Ht 5' 1.75" (1.568 m)   Wt 131 lb 9.6 oz (59.7 kg)   SpO2 99% Comment: room air  BMI 24.27 kg/m   General Appearance: Alert, cooperative, no distress, appears stated age Head: Normocephalic, without  obvious abnormality, atraumatic Eyes: PERRL, conjunctiva/corneas clear, EOM's intact,  Ears: Normal TM's and external ear canals Nose: Nares normal, mucosa normal, no drainage or sinus tenderness Throat: Lips, mucosa, and tongue normal; teeth and gums normal Neck: Supple, no lymphadenopathy;  thyroid:  no enlargement/tenderness/nodules; no carotid bruit or  JVD Lungs: Clear to auscultation bilaterally without wheezes, rales or ronchi; respirations unlabored Heart: Regular rate and rhythm, S1 and S2 normal, no murmur, rubor gallop Abdomen: Soft, non-tender, nondistended, normoactive bowel sounds,  no masses, no hepatosplenomegaly Skin:  Skin color, texture, turgor normal, no rashes or lesions Lymph nodes: Cervical, supraclavicular, and axillary nodes normal Neurologic:  CNII-XII intact, normal strength, sensation and gait; reflexes 2+ and symmetric throughout Psych: Normal mood, affect, hygiene and grooming. Hb A1c 7.3 ASSESSMENT/PLAN: Hypertension associated with diabetes ([place]) - Plan: CBC with Differential/Platelet, Comprehensive metabolic panel, irbesartan-hydrochlorothiazide (AVALIDE) 150-12.5 MG tablet, empagliflozin (JARDIANCE) 25 MG TABS tablet  Type 2 diabetes mellitus with other specified complication, without long-term current use of insulin (HCC) - Plan: CBC with Differential/Platelet, Comprehensive metabolic panel, Lipid panel, Semaglutide, 1 MG/DOSE, 4 MG/3ML SOPN  Hyperlipidemia associated with type 2 diabetes mellitus ([place]) - Plan: Lipid panel, atorvastatin (LIPITOR) 20 MG tablet  Encounter for long-term (current) use of medications  Adverse effect of metformin, subsequent encounter  Routine general medical examination at a health care facility Discussed the possibility of increasing her Ozempic but will give her a chance to make further diet and exercise changes before doing that.  Continue on present medication regimen.   Immunization recommendations discussed.  Colonoscopy recommendations reviewed   Medicare Attestation I have personally reviewed: The patient's medical and social history Their use of alcohol, tobacco or illicit drugs Their current medications and supplements The patient's functional ability including ADLs,fall risks, home safety risks, cognitive, and hearing and visual impairment Diet and physical  activities Evidence for depression or mood disorders  The patient's weight, height, and BMI have been recorded in the chart.  I have made referrals, counseling, and provided education to the patient based on review of the above and I have provided the patient with a written personalized care plan for preventive services.     Jill Alexanders, MD   06/14/2022

## 2022-06-14 NOTE — Telephone Encounter (Signed)
Received fax missing income, pt brought my social security statement, Refaxed the application with missing info.  Samples given

## 2022-06-14 NOTE — Telephone Encounter (Signed)
Pt Assistance applications completed & samples given

## 2022-06-15 LAB — LIPID PANEL
Chol/HDL Ratio: 2.8 ratio (ref 0.0–4.4)
Cholesterol, Total: 123 mg/dL (ref 100–199)
HDL: 44 mg/dL (ref >39)
LDL Chol Calc (NIH): 65 mg/dL (ref 0–99)
Triglycerides: 66 mg/dL (ref 0–149)
VLDL Cholesterol Cal: 14 mg/dL (ref 5–40)

## 2022-06-15 LAB — CBC WITH DIFFERENTIAL/PLATELET
Basophils Absolute: 0 10*3/uL (ref 0.0–0.2)
Basos: 0 %
EOS (ABSOLUTE): 0.1 10*3/uL (ref 0.0–0.4)
Eos: 2 %
Hematocrit: 37.6 % (ref 34.0–46.6)
Hemoglobin: 12.1 g/dL (ref 11.1–15.9)
Immature Grans (Abs): 0 10*3/uL (ref 0.0–0.1)
Immature Granulocytes: 0 %
Lymphocytes Absolute: 3.7 10*3/uL — ABNORMAL HIGH (ref 0.7–3.1)
Lymphs: 50 %
MCH: 28.9 pg (ref 26.6–33.0)
MCHC: 32.2 g/dL (ref 31.5–35.7)
MCV: 90 fL (ref 79–97)
Monocytes Absolute: 0.4 10*3/uL (ref 0.1–0.9)
Monocytes: 6 %
Neutrophils Absolute: 3.1 10*3/uL (ref 1.4–7.0)
Neutrophils: 42 %
Platelets: 259 10*3/uL (ref 150–450)
RBC: 4.19 x10E6/uL (ref 3.77–5.28)
RDW: 12.5 % (ref 11.7–15.4)
WBC: 7.3 10*3/uL (ref 3.4–10.8)

## 2022-06-15 LAB — COMPREHENSIVE METABOLIC PANEL
ALT: 15 IU/L (ref 0–32)
AST: 18 IU/L (ref 0–40)
Albumin/Globulin Ratio: 1.4 (ref 1.2–2.2)
Albumin: 4.3 g/dL (ref 3.9–4.9)
Alkaline Phosphatase: 132 IU/L — ABNORMAL HIGH (ref 44–121)
BUN/Creatinine Ratio: 14 (ref 12–28)
BUN: 13 mg/dL (ref 8–27)
Bilirubin Total: 0.6 mg/dL (ref 0.0–1.2)
CO2: 27 mmol/L (ref 20–29)
Calcium: 9.4 mg/dL (ref 8.7–10.3)
Chloride: 100 mmol/L (ref 96–106)
Creatinine, Ser: 0.95 mg/dL (ref 0.57–1.00)
Globulin, Total: 3 g/dL (ref 1.5–4.5)
Glucose: 108 mg/dL — ABNORMAL HIGH (ref 70–99)
Potassium: 3 mmol/L — ABNORMAL LOW (ref 3.5–5.2)
Sodium: 143 mmol/L (ref 134–144)
Total Protein: 7.3 g/dL (ref 6.0–8.5)
eGFR: 66 mL/min/{1.73_m2} (ref >59)

## 2022-07-20 ENCOUNTER — Telehealth: Payer: Self-pay | Admitting: Family Medicine

## 2022-07-20 NOTE — Telephone Encounter (Signed)
Pt called running out of [place], she has been approved for both Pt Assistance plans they are behind on shipping, samples given to hold pt

## 2022-07-26 NOTE — Telephone Encounter (Signed)
Pt Assistance Ozempic received, left message for pt

## 2022-08-06 DIAGNOSIS — E119 Type 2 diabetes mellitus without complications: Secondary | ICD-10-CM | POA: Diagnosis not present

## 2022-08-06 DIAGNOSIS — Z961 Presence of intraocular lens: Secondary | ICD-10-CM | POA: Diagnosis not present

## 2022-08-06 LAB — HM DIABETES EYE EXAM

## 2022-08-30 NOTE — Telephone Encounter (Signed)
Called [employer] & they are unable to accept last years LIS denial,  I called pt and explained she will need to reapply for LIS & gave her the contact number.  T# [phone]

## 2022-09-20 ENCOUNTER — Other Ambulatory Visit: Payer: Self-pay

## 2022-09-20 DIAGNOSIS — E1169 Type 2 diabetes mellitus with other specified complication: Secondary | ICD-10-CM

## 2022-09-26 ENCOUNTER — Telehealth: Payer: Self-pay

## 2022-09-26 NOTE — Telephone Encounter (Signed)
Jardiance  samples #4 given to pt to hold

## 2022-09-26 NOTE — Progress Notes (Signed)
Patient ID: Aimee Sharp, female   DOB: 12/29/1955, 67 y.o.   MRN: [id number]   Care Management & Coordination Services Pharmacy Team    Chart Prep for in office appointment with Milas Kocher, PharmD on 10/04/22 at [facility] with patient on 09/26/2022   Have you seen any other providers since your last visit? Patient reports none  Any changes in your medications or health? Patent reports none  Any side effects from any medications? Patient reports no  Do you have an symptoms or problems not managed by your medications? Patient reports no  Any concerns about your health right now? Patient reports none  Has your provider asked that you check blood pressure, blood sugar, or follow special diet at home? Patient reports she is checking blood pressures and sugars and is in agreement to bring her supplies to the appointment with her to check for accuracy  Do you get any type of exercise on a regular basis? Patient reports no and she is not using any assistive devices for mobility  Can you think of a goal you would like to reach for your health? Patient reports none  Do you have any problems getting your medications? Patient reports her Jardiance at present with patient assistance.  Is there anything that you would like to discuss during the appointment? Patient reports no  Patient assistance for the following mediations: Jardiance & Ozempic Patient reports she has been receiving her Ozempic but not the Jardiance and has not heard anything about it, Call to [facility] cares was advised her application is missing her denial of income letter from [employer] for processing. Vernona Rieger at [facility] has made pt aware of this and spoken with her about picking up of samples.  Patient aware to bring blood pressure cuff, medications that do not need refrigeration and supplements to appointment    Chart review:  Recent office visits:  06/14/22 Ronnald Nian, MD  - Patient presented for Routine general medical  examination at a health care facility and other concerns. No medication changes.  04/09/22  Ronnald Nian, MD  - Patient presented for Type 2 diabetes mellitus with other specified complication without long term current use of insulin and other concerns. No medication changes.  Recent consult visits:  None  Hospital visits:  None in previous 6 months   Fill History: AMOXICILLIN    CAP 08/29/2020 7   ATORVASTATIN    TAB 02/27/2022 90   JARDIANCE 25 MG PO TABS 02/11/2022 90   ONETOUCH VERIO TES 05/03/2022 90   IRBESAR/HCTZ 150-12.5MG  TAB 04/20/2022 30   OZEMPIC (1 MG/DOSE) 4 MG/3ML [place] SOPN 12/14/2021 28      Star Rating Drugs:  Atorvastatin 20 mg - Last filled 02/27/22 90 DS at [facility] Jardiance 25 mg - Last filled 02/11/22 90 DS at [facility] Semaglutide 1 mg - Last filled  12/14/21 28 DS at [facility]   Care Gaps: Foot Exam - Overdue COVID Booster - Postponed AWV - 06/14/22    Pamala Duffel CMA Clinical Pharmacist Assistant [phone]

## 2022-10-03 ENCOUNTER — Telehealth: Payer: Self-pay

## 2022-10-03 NOTE — Progress Notes (Unsigned)
Care Management & Coordination Services Pharmacy Note  10/04/2022 Name:  Aimee Sharp MRN:  [id number] DOB:  August 17, 1955  Summary: BP not at goal <130/80 but is <140/90, reports controlled at home A1C not at goal <7, not checking sugars at home LDL at goal <70  Recommendations/Changes made from today's visit: -Counseled on proper BP monitoring technique and to check at least once weekly -Counseled to be mindful of sodium intake, especially in fast food -Counseled on low-carb diet extensively (limiting bread, white rice, potatoes) and not drinking sugars -Counseled on importance of routine at home sugar monitoring -Counseled on importance of adherence to statin medication  Follow up plan: DM call in 2 months Pharmacist visit in 6 months   Subjective: Aimee Sharp is an 67 y.o. year old female who is a primary patient of Ronnald Nian, MD.  The care coordination team was consulted for assistance with disease management and care coordination needs.    Engaged with patient face to face for initial visit. Patient presents with her medications, bp cuff, and DM meter. Does eat three meals a day and drinks sweet tea and water. Does admit to eating too much bread, potatoes, and white rice. Stays on the go with her 74 year old grand-daughter for travel volleyball games and her 21 year old grandson. Currently busy helping her sister with errands post knee surgery.  Recent office visits: 06/14/22 Ronnald Nian, MD  - Patient presented for Routine general medical examination at a health care facility and other concerns. No medication changes.   04/09/22  Ronnald Nian, MD  - Patient presented for Type 2 diabetes mellitus with other specified complication without long term current use of insulin and other concerns. No medication changes.  Recent consult visits: None  Hospital visits: None in previous 6 months   Objective:  Lab Results  Component Value Date   CREATININE 0.95  06/14/2022   BUN 13 06/14/2022   EGFR 66 06/14/2022   GFRNONAA 64 04/22/2020   GFRAA 74 04/22/2020   NA 143 06/14/2022   K 3.0 (L) 06/14/2022   CALCIUM 9.4 06/14/2022   CO2 27 06/14/2022   GLUCOSE 108 (H) 06/14/2022    Lab Results  Component Value Date/Time   HGBA1C 7.3 (A) 06/14/2022 09:19 AM   HGBA1C 6.8 (A) 04/09/2022 03:09 PM   HGBA1C 8.3 (H) 06/18/2011 04:16 PM   MICROALBUR 18.8 12/08/2021 11:47 AM   MICROALBUR 24.4 10/14/2019 04:33 PM    Last diabetic Eye exam:  Lab Results  Component Value Date/Time   HMDIABEYEEXA No Retinopathy 08/06/2022 08:52 AM    Last diabetic Foot exam: No results found for: "HMDIABFOOTEX"   Lab Results  Component Value Date   CHOL 123 06/14/2022   HDL 44 06/14/2022   LDLCALC 65 06/14/2022   TRIG 66 06/14/2022   CHOLHDL 2.8 06/14/2022       Latest Ref Rng & Units 06/14/2022    9:00 AM 06/09/2021    9:16 AM 04/22/2020   11:42 AM  Hepatic Function  Total Protein 6.0 - 8.5 g/dL 7.3  7.6  8.0   Albumin 3.9 - 4.9 g/dL 4.3  4.4  4.4   AST 0 - 40 IU/L 18  16  19    ALT 0 - 32 IU/L 15  18  20    Alk Phosphatase 44 - 121 IU/L 132  186  218    209   Total Bilirubin 0.0 - 1.2 mg/dL 0.6  0.4  0.7  No results found for: "TSH", "FREET4"     Latest Ref Rng & Units 06/14/2022    9:00 AM 06/09/2021    9:16 AM 04/22/2020   11:42 AM  CBC  WBC 3.4 - 10.8 x10E3/uL 7.3  7.6  9.4   Hemoglobin 11.1 - 15.9 g/dL 16.1  09.6  04.5   Hematocrit 34.0 - 46.6 % 37.6  35.9  39.8   Platelets 150 - 450 x10E3/uL 259  369  262     Lab Results  Component Value Date/Time   VD25OH 16.2 (L) 04/22/2020 11:42 AM   VD25OH 16.5 (L) 06/17/2019 10:47 AM    Clinical ASCVD: No  The ASCVD Risk score (Arnett DK, et al., 2019) failed to calculate for the following reasons:   The valid total cholesterol range is 130 to 320 mg/dL    DEXA 4/0/98 - Normal     06/14/2022    8:22 AM 06/09/2021    8:33 AM 06/08/2020   10:22 AM  Depression screen PHQ 2/9  Decreased Interest  0 0 0  Down, Depressed, Hopeless 0 0 0  PHQ - 2 Score 0 0 0     Social History   Tobacco Use  Smoking Status Never  Smokeless Tobacco Never   BP Readings from Last 3 Encounters:  06/14/22 130/86  04/09/22 128/86  12/23/21 119/82   Pulse Readings from Last 3 Encounters:  06/14/22 75  04/09/22 66  12/23/21 72   Wt Readings from Last 3 Encounters:  06/14/22 131 lb 9.6 oz (59.7 kg)  04/09/22 134 lb 3.2 oz (60.9 kg)  12/08/21 137 lb 6.4 oz (62.3 kg)   BMI Readings from Last 3 Encounters:  06/14/22 24.27 kg/m  04/09/22 25.36 kg/m  12/08/21 25.96 kg/m    No Known Allergies  Medications Reviewed Today     Reviewed by Sherrill Raring, RPH (Pharmacist) on 10/04/22 at 0931  Med List Status: <None>   Medication Order Taking? Sig Documenting Provider Last Dose Status Informant  atorvastatin (LIPITOR) 20 MG tablet [id number] Yes Take 1 tablet (20 mg total) by mouth daily. Ronnald Nian, MD Taking Active   Blood Glucose Monitoring Suppl (ACCU-CHEK AVIVA PLUS) w/Device KIT [id number] Yes Accu check Aviva plus machine. To check blood sugar 3-4 times a day. With test strips Ronnald Nian, MD Taking Active   empagliflozin (JARDIANCE) 25 MG TABS tablet [id number] Yes Take 1 tablet (25 mg total) by mouth daily before breakfast. Ronnald Nian, MD Taking Active   glucose blood test strip [id number]  Aviva plus test strips, testing 3-4 times a day. Ronnald Nian, MD  Active   glucose blood test strip [id number]  Check once daily Use as instructed Ronnald Nian, MD  Active   irbesartan-hydrochlorothiazide (AVALIDE) 150-12.5 MG tablet [id number] Yes Take 1 tablet by mouth daily. Ronnald Nian, MD Taking Active   Semaglutide, 1 MG/DOSE, 4 MG/3ML Namon Cirri [id number] Yes Inject 1 mg as directed once a week. Ronnald Nian, MD Taking Active             SDOH:  (Social Determinants of Health) assessments and interventions performed: Yes SDOH Interventions    Flowsheet Row Care  Coordination from 10/04/2022 in [facility]  SDOH Interventions   Food Insecurity Interventions Intervention Not Indicated  Housing Interventions Intervention Not Indicated       Medication Assistance: Getting Ozempic through [employer]. Application to [employer] pending/LIS.  Medication Access: Within the past 30 days, how  often has patient missed a dose of medication? None Is a pillbox or other method used to improve adherence? No  Factors that may affect medication adherence? financial need Are meds synced by current pharmacy? No  Are meds delivered by current pharmacy? No Does patient experience delays in picking up medications due to transportation concerns? No   [facility] Reviewed: Is patient disadvantaged to use [facility]?: Yes  Current Rx insurance plan: HTA Name and location of Current pharmacy:  [id number] - [address] ([address]), [place] - [address] [place] ([place]) [place] [address] Phone: [phone] Fax: [phone]  [facility] [place], [place] - [address] [place] [address] Phone: [phone] Fax: [phone]  [facility] services reviewed with patient today?: No  Patient requests to transfer care to [facility]?: No  Reason patient declined to change pharmacies: Disadvantaged due to insurance/mail order  Compliance/Adherence/Medication fill history: Care Gaps: Foot Exam - Overdue COVID Booster - Postponed AWV - 06/14/22  Star-Rating Drugs: Atorvastatin 20 mg - Last filled 02/27/22 90 [facility] at [facility] Jardiance 25 mg - Last filled 02/11/22 90 DS at [facility] Semaglutide 1 mg - Last filled  12/14/21 28 DS at [facility]   Assessment/Plan   Hypertension (BP goal <130/80) -Controlled -Current treatment: Irbesartan/HCTZ 150-12.5mg  1 tab daily Appropriate, Effective, Safe, Accessible -Medications previously tried: Amlodipine,  Carvedilol, Olmesartan -Current home readings: no log, hasn't been checking lately but does have a wrist cuff and reports <130/80 when she does check -Current dietary habits: does eat out quite a bit and has salty foods -Current exercise habits: no routine but stays on the move -Denies hypotensive/hypertensive symptoms -Educated on BP goals and benefits of medications for prevention of heart attack, stroke and kidney damage; Daily salt intake goal < 2300 mg; Importance of home blood pressure monitoring; Proper BP monitoring technique; -Counseled to monitor BP at home once weekly, document, and provide log at future appointments -Recommended to continue current medication  Hyperlipidemia: (LDL goal < 70) -Controlled -Current treatment: Atorvastatin 20mg  1 qd Appropriate, Effective, Safe, Accessible -Medications previously tried: None  -Current dietary patterns: does eat fried foods and fast food, pork -Current exercise habits: see above -Educated on Cholesterol goals;  Benefits of statin for ASCVD risk reduction; Importance of limiting foods high in cholesterol; -Recommended to continue current medication  Diabetes (A1c goal <7%) -Uncontrolled -Current medications: Jardiance 25mg  1 qd Appropriate, Effective, Safe, Accessible Ozempic 1mg  once weekly Appropriate, Effective, Safe, Accessible -Medications previously tried: Oseni, Bydureon, Glyxambi, Metformin, Januvia -Current home glucose readings Has not checked her sugars for at least 30 days, says she stays "too busy", not at home often and leaves her device there -Denies hypoglycemic/hyperglycemic symptoms -Current meal patterns:  breakfast: grits, eggs, bacon, sausage  lunch: sandwich, hot dog, hamburger, something fast food usually  dinner: chicken, porkchops, potatoes, rice, bread snacks: none drinks: sweet tea with regular sugar, water with crystal lite -Current exercise: see above -Educated on A1c and blood sugar  goals; Complications of diabetes including kidney damage, retinal damage, and cardiovascular disease; Benefits of routine self-monitoring of blood sugar; -Counseled to check feet daily and get yearly eye exams -Counseled on diet and exercise extensively Recommended to continue current medication  Sherrill Raring Clinical Pharmacist [phone]

## 2022-10-03 NOTE — Progress Notes (Signed)
Patient ID: Caswell Corwin, female   DOB: 10/20/1955, 67 y.o.   MRN: [id number]   Care Management & Coordination Services Pharmacy Team  Reason for Encounter: Appointment Reminder  Contacted patient to confirm in office appointment with Milas Kocher, PharmD on 10/04/22 at 9. Unsuccessful outreach. Left voicemail for patient to return call.    Star Rating Drugs:  Atorvastatin 20 mg - Last filled 02/27/22 [facility] Jardiance 25 mg - Last filled 02/11/22 90 DS at [facility] Semaglutide 1 mg - Last filled  12/14/21 28 DS at [facility]     Care Gaps: Foot Exam - Overdue COVID Booster - Postponed AWV - 06/14/22   Pamala Duffel CMA Clinical Pharmacist Assistant [phone]

## 2022-10-04 ENCOUNTER — Telehealth: Payer: Self-pay

## 2022-10-04 ENCOUNTER — Ambulatory Visit: Payer: PPO

## 2022-10-04 NOTE — Telephone Encounter (Signed)
Contacted [employer] on behalf of patient.  Her application is currently paused as patient qualifies for LIS/Extra Help through Medicare.  Patient would have to provide a denial letter or apply and see if she gets approved (if so, she would not qualify for Jardiance PAP).  Contacted patient to inform and had to leave a voicemail. Phone number for patient to apply is [phone].  Sherrill Raring Clinical Pharmacist [phone]

## 2022-10-05 NOTE — Progress Notes (Signed)
Call back to patient per Milas Kocher, was able to reach her she expresses she got the message and will follow up with them and see if they can send her proof of her application as she did it with them over the phone, she has the number to reach them and states she will call myself or the office back with further information once she has it.    Pamala Duffel CMA Clinical Pharmacist Assistant [phone]

## 2022-10-31 ENCOUNTER — Telehealth: Payer: Self-pay | Admitting: Family Medicine

## 2022-10-31 NOTE — Telephone Encounter (Signed)
Aimee Sharp, Pt states she still hasn't gotten any LIS paperwork & she doesn't know what else to do ?  She said she tried to call you  I gave her samples of Jardiance 25mg  do you have any other suggestions.

## 2022-11-01 NOTE — Progress Notes (Cosign Needed)
Follow up with pt on LIS paperwork she reports she had not called them , re supplied her the number and asked she call me back after with update, she reports that they have not yet processed her application and was told it may take up to 200 days and when they have reached a decision they will mail her notice of the decision.    Aimee Sharp CMA Clinical Pharmacist Assistant [phone]

## 2022-11-02 ENCOUNTER — Telehealth: Payer: Self-pay | Admitting: Family Medicine

## 2022-11-02 NOTE — Telephone Encounter (Signed)
PT ASSISTANCE OZEMPIC 2024  medication received, called pt & informed

## 2022-11-02 NOTE — Telephone Encounter (Signed)
Spoke with patient and LIS application has been submitted. Pending approval response. Response time could be anywhere from days to several months as Medicare is backlogged on applications and no way to expedite. If no decision made in timely manner, may need to consider other options. Will await response for now as patient has samples.

## 2022-11-08 ENCOUNTER — Encounter: Payer: Self-pay | Admitting: Family Medicine

## 2022-11-08 ENCOUNTER — Ambulatory Visit (INDEPENDENT_AMBULATORY_CARE_PROVIDER_SITE_OTHER): Payer: PPO | Admitting: Family Medicine

## 2022-11-08 VITALS — BP 130/80 | HR 74 | Temp 97.7°F | Resp 14 | Wt 132.2 lb

## 2022-11-08 DIAGNOSIS — I152 Hypertension secondary to endocrine disorders: Secondary | ICD-10-CM | POA: Diagnosis not present

## 2022-11-08 DIAGNOSIS — E1169 Type 2 diabetes mellitus with other specified complication: Secondary | ICD-10-CM | POA: Diagnosis not present

## 2022-11-08 DIAGNOSIS — E785 Hyperlipidemia, unspecified: Secondary | ICD-10-CM | POA: Diagnosis not present

## 2022-11-08 DIAGNOSIS — E1159 Type 2 diabetes mellitus with other circulatory complications: Secondary | ICD-10-CM | POA: Diagnosis not present

## 2022-11-08 DIAGNOSIS — Z79899 Other long term (current) drug therapy: Secondary | ICD-10-CM | POA: Diagnosis not present

## 2022-11-08 LAB — POCT GLYCOSYLATED HEMOGLOBIN (HGB A1C): Hemoglobin A1C: 6.7 % — AB (ref 4.0–5.6)

## 2022-11-08 LAB — COMPREHENSIVE METABOLIC PANEL
ALT: 18 IU/L (ref 0–32)
AST: 22 IU/L (ref 0–40)
Albumin/Globulin Ratio: 1.6 (ref 1.2–2.2)
Albumin: 4.2 g/dL (ref 3.9–4.9)
Alkaline Phosphatase: 147 IU/L — ABNORMAL HIGH (ref 44–121)
BUN/Creatinine Ratio: 12 (ref 12–28)
BUN: 10 mg/dL (ref 8–27)
Bilirubin Total: 0.4 mg/dL (ref 0.0–1.2)
CO2: 27 mmol/L (ref 20–29)
Calcium: 9.5 mg/dL (ref 8.7–10.3)
Chloride: 103 mmol/L (ref 96–106)
Creatinine, Ser: 0.81 mg/dL (ref 0.57–1.00)
Globulin, Total: 2.7 g/dL (ref 1.5–4.5)
Glucose: 98 mg/dL (ref 70–99)
Potassium: 3.4 mmol/L — ABNORMAL LOW (ref 3.5–5.2)
Sodium: 144 mmol/L (ref 134–144)
Total Protein: 6.9 g/dL (ref 6.0–8.5)
eGFR: 80 mL/min/{1.73_m2} (ref >59)

## 2022-11-08 MED ORDER — SEMAGLUTIDE (1 MG/DOSE) 4 MG/3ML ~~LOC~~ SOPN: 1.0000 mg | PEN_INJECTOR | SUBCUTANEOUS | 5 refills | Status: DC

## 2022-11-08 MED ORDER — EMPAGLIFLOZIN 25 MG PO TABS: 25.0000 mg | ORAL_TABLET | Freq: Every day | ORAL | 1 refills | Status: DC

## 2022-11-08 NOTE — Progress Notes (Signed)
  Subjective:    Patient ID: Aimee Sharp, female    DOB: 02-15-1956, 67 y.o.   MRN: [id number]  Aimee Sharp is a 67 y.o. female who presents for follow-up of Type 2 diabetes mellitus.  Home blood sugar records:  blood sugars not checked Current symptoms/problems include paresthesia of the feet and have been stable. Daily foot checks: yes   Any foot concerns: no How often blood sugars checked: rarely Exercise: The patient does not participate in regular exercise at present. Diet: regular diet She continues on Jardiance and Ozempic through patient assistance program.  She is also taking atorvastatin and irbesartan and having no difficulty with that.  She is really not sure how she has made changes in her regimen but her A1c does show an improvement. The following portions of the patient's history were reviewed and updated as appropriate: allergies, current medications, past medical history, past social history and problem list.  ROS as in subjective above.     Objective:    Physical Exam Alert and in no distress otherwise not examined. Hemoglobin A1c is 6.7 Blood pressure 130/80, pulse 74, temperature 97.7 F (36.5 C), temperature source Oral, resp. rate 14, weight 132 lb 3.2 oz (60 kg), SpO2 99 %.  Lab Review    Latest Ref Rng & Units 06/14/2022    9:19 AM 06/14/2022    9:00 AM 04/09/2022    3:09 PM 12/08/2021   11:47 AM 12/08/2021   10:50 AM  Diabetic Labs  HbA1c 4.0 - 5.6 % 7.3   6.8   7.3   Microalbumin mg/L    18.8    Micro/Creat Ratio     16.2    Chol 100 - 199 mg/dL  782      HDL >95 mg/dL  44      Calc LDL 0 - 99 mg/dL  65      Triglycerides 0 - 149 mg/dL  66      Creatinine 6.21 - 1.00 mg/dL  3.08          11/06/7844    8:23 AM 06/14/2022    8:22 AM 04/09/2022   10:22 AM 12/23/2021    2:30 PM 12/08/2021   10:35 AM  BP/Weight  Systolic BP 130 130 128 119 136  Diastolic BP 80 86 86 82 86  Wt. (Lbs) 132.2 131.6 134.2  137.4  BMI 24.38 kg/m2 24.27 kg/m2 25.36 kg/m2   25.96 kg/m2      Latest Ref Rng & Units 08/06/2022    8:52 AM 06/09/2021    8:30 AM  Foot/eye exam completion dates  Eye Exam No Retinopathy No Retinopathy       Foot Form Completion   Done     This result is from an external source.    Michell  reports that she has never smoked. She has never used smokeless tobacco. She reports that she does not drink alcohol and does not use drugs.     Assessment & Plan:    Type 2 diabetes mellitus with other specified complication, without long-term current use of insulin (HCC) - Plan: POCT glycosylated hemoglobin (Hb A1C)  Hypertension associated with diabetes (HCC)  Hyperlipidemia associated with type 2 diabetes mellitus (HCC)  Encounter for long-term (current) use of medications  Jardiance, Ozempic were renewed through patient assistance.  She will continue on her atorvastatin and irbesartan.  Recheck here in 4 months.

## 2023-01-04 ENCOUNTER — Telehealth: Payer: Self-pay

## 2023-01-04 NOTE — Telephone Encounter (Signed)
Pt stated she is out of jardiance 25 mg. Pt is requesting samples of jardiance. Please advise.

## 2023-01-28 ENCOUNTER — Telehealth: Payer: Self-pay | Admitting: Family Medicine

## 2023-01-28 MED ORDER — ONETOUCH DELICA LANCETS 33G MISC: 1 refills | Status: AC

## 2023-01-28 NOTE — Telephone Encounter (Signed)
done

## 2023-01-28 NOTE — Telephone Encounter (Signed)
Pt needs rx for one touch Dura flex needles  [facility]

## 2023-03-07 NOTE — Telephone Encounter (Signed)
Called about pt's last shipment & stated provider's state license had expired, gave new info & they ordered shipment will be 14 days, asked for expedited shipment and was given voucher for free 30 days to local pharmacy,  [phone].  Pt informed

## 2023-03-07 NOTE — Telephone Encounter (Signed)
Jardiance samples given

## 2023-03-21 NOTE — Telephone Encounter (Signed)
Pt assistance Ozempic received, sent mychart message

## 2023-04-09 ENCOUNTER — Telehealth: Payer: Self-pay | Admitting: Family Medicine

## 2023-04-09 NOTE — Telephone Encounter (Signed)
Called pt to advise Ozempic is here hasn't been picked up yet & also have Jardiance samples.  Need reapply for 2025, she will sign application when she comes by the office.

## 2023-04-19 ENCOUNTER — Telehealth: Payer: Self-pay | Admitting: Family Medicine

## 2023-04-19 ENCOUNTER — Other Ambulatory Visit (INDEPENDENT_AMBULATORY_CARE_PROVIDER_SITE_OTHER): Payer: PPO

## 2023-04-19 DIAGNOSIS — Z23 Encounter for immunization: Secondary | ICD-10-CM | POA: Diagnosis not present

## 2023-04-19 NOTE — Telephone Encounter (Signed)
PT ASSISTANCE JARDIANCE 2025, pt stopped by office & completed form

## 2023-04-20 NOTE — Telephone Encounter (Signed)
New applic completed

## 2023-04-28 DIAGNOSIS — Z13228 Encounter for screening for other metabolic disorders: Secondary | ICD-10-CM | POA: Diagnosis not present

## 2023-04-30 ENCOUNTER — Other Ambulatory Visit: Payer: Self-pay | Admitting: Family Medicine

## 2023-04-30 DIAGNOSIS — E1169 Type 2 diabetes mellitus with other specified complication: Secondary | ICD-10-CM

## 2023-05-06 ENCOUNTER — Other Ambulatory Visit: Payer: Self-pay | Admitting: Family Medicine

## 2023-05-06 NOTE — Telephone Encounter (Signed)
[employer] fax received 2025 PAP approved

## 2023-06-14 ENCOUNTER — Telehealth: Payer: Self-pay | Admitting: Family Medicine

## 2023-06-14 NOTE — Telephone Encounter (Signed)
 PAP OZEMPIC APPROVED 2025 per Marylene Land approved til 06/18/24

## 2023-06-18 ENCOUNTER — Ambulatory Visit: Payer: PPO

## 2023-06-18 VITALS — BP 128/78 | HR 83 | Temp 97.5°F | Ht 62.0 in | Wt 136.2 lb

## 2023-06-18 DIAGNOSIS — Z0000 Encounter for general adult medical examination without abnormal findings: Secondary | ICD-10-CM | POA: Diagnosis not present

## 2023-06-18 NOTE — Progress Notes (Signed)
 Subjective:   Aimee Sharp is a 68 y.o. female who presents for Medicare Annual (Subsequent) preventive examination.  Visit Complete: In person    Cardiac Risk Factors include: advanced age (>17men, >61 women);diabetes mellitus;dyslipidemia;hypertension     Objective:    Today's Vitals   06/18/23 0809  BP: 128/78  Pulse: 83  Temp: (!) 97.5 F (36.4 C)  TempSrc: Oral  SpO2: 97%  Weight: 136 lb 3.2 oz (61.8 kg)  Height: 5' 2 (1.575 m)   Body mass index is 24.91 kg/m.     06/18/2023    8:15 AM 06/09/2021    8:32 AM 03/15/2016    3:21 PM 04/23/2013    4:36 PM  Advanced Directives  Does Patient Have a Medical Advance Directive? Yes No No Patient does not have advance directive;Patient would not like information  Type of Public Librarian Power of [place];Living will     Copy of Healthcare Power of Attorney in Chart? Yes - validated most recent copy scanned in chart (See row information)     Would patient like information on creating a medical advance directive?  Yes (ED - Information included in AVS)      Current Medications (verified) Outpatient Encounter Medications as of 06/18/2023  Medication Sig   atorvastatin  (LIPITOR) 20 MG tablet Take 1 tablet by mouth once daily   Blood Glucose Monitoring Suppl (ACCU-CHEK AVIVA PLUS) w/Device KIT Accu check Aviva plus machine. To check blood sugar 3-4 times a day. With test strips   empagliflozin  (JARDIANCE ) 25 MG TABS tablet Take 1 tablet (25 mg total) by mouth daily before breakfast.   glucose blood test strip Aviva plus test strips, testing 3-4 times a day.   glucose blood test strip USE AS DIRECTED ONCE DAILY   irbesartan -hydrochlorothiazide  (AVALIDE) 150-12.5 MG tablet Take 1 tablet by mouth daily.   OneTouch Delica Lancets 33G MISC Test once a daily   Semaglutide , 1 MG/DOSE, 4 MG/3ML SOPN Inject 1 mg as directed once a week.   No facility-administered encounter medications on file as of 06/18/2023.     Allergies (verified) Patient has no known allergies.   History: Past Medical History:  Diagnosis Date   Diabetes mellitus    Family history of colon cancer in mother 06/10/2017   Hypertension    Wears glasses    read   Wears partial dentures    upper partial   Past Surgical History:  Procedure Laterality Date   COLONOSCOPY     MASS EXCISION Left 04/29/2013   Procedure: LEFT THUMB DEEP MASS EXCISION;  Surgeon: Prentice LELON Pagan, MD;  Location: [facility];  Service: Orthopedics;  Laterality: Left;   no surgeries to date     ORIF ANKLE FRACTURE     right in college   TUBAL LIGATION     WISDOM TOOTH EXTRACTION     WRIST GANGLION EXCISION  1989   right   Family History  Problem Relation Age of Onset   Cancer Mother    Social History   Socioeconomic History   Marital status: Single    Spouse name: Not on file   Number of children: Not on file   Years of education: Not on file   Highest education level: Not on file  Occupational History   Not on file  Tobacco Use   Smoking status: Never   Smokeless tobacco: Never  Vaping Use   Vaping status: Never Used  Substance and Sexual Activity   Alcohol use: No  Drug use: No   Sexual activity: Not on file  Other Topics Concern   Not on file  Social History Narrative   Not on file   Social Drivers of Health   Financial Resource Strain: Low Risk  (06/18/2023)   Overall Financial Resource Strain (CARDIA)    Difficulty of Paying Living Expenses: Not hard at all  Food Insecurity: No Food Insecurity (06/18/2023)   Hunger Vital Sign    Worried About Running Out of Food in the Last Year: Never true    Ran Out of Food in the Last Year: Never true  Transportation Needs: No Transportation Needs (06/18/2023)   PRAPARE - Administrator, Civil Service (Medical): No    Lack of Transportation (Non-Medical): No  Physical Activity: Insufficiently Active (06/18/2023)   Exercise Vital Sign    Days of Exercise  per Week: 1 day    Minutes of Exercise per Session: 60 min  Stress: No Stress Concern Present (06/18/2023)   [employer] of Occupational Health - Occupational Stress Questionnaire    Feeling of Stress : Not at all  Social Connections: Moderately Isolated (06/18/2023)   Social Connection and Isolation Panel [NHANES]    Frequency of Communication with Friends and Family: More than three times a week    Frequency of Social Gatherings with Friends and Family: More than three times a week    Attends Religious Services: More than 4 times per year    Active Member of [employer] or Organizations: No    Attends Engineer, Structural: Never    Marital Status: Never married    Tobacco Counseling Counseling given: Not Answered   Clinical Intake:  Pre-visit preparation completed: Yes  Pain : No/denies pain     Nutritional Status: BMI of 19-24  Normal Nutritional Risks: None Diabetes: Yes CBG done?: No Did pt. bring in CBG monitor from home?: No  How often do you need to have someone help you when you read instructions, pamphlets, or other written materials from your doctor or pharmacy?: 1 - Never  Interpreter Needed?: No  Information entered by :: NAllen LPN   Activities of Daily Living    06/18/2023    8:11 AM  In your present state of health, do you have any difficulty performing the following activities:  Hearing? 0  Vision? 0  Difficulty concentrating or making decisions? 0  Walking or climbing stairs? 0  Dressing or bathing? 0  Doing errands, shopping? 0  Preparing Food and eating ? N  Using the Toilet? N  In the past six months, have you accidently leaked urine? N  Do you have problems with loss of bowel control? N  Managing your Medications? N  Managing your Finances? N  Housekeeping or managing your Housekeeping? N    Patient Care Team: Joyce Norleen BROCKS, MD as PCP - General (Family Medicine) Lionell, Jon DEL, [facility] (Pharmacist) [facility]  Indicate any recent Medical Services you may have received from other than [facility] providers in the past year (date may be approximate).     Assessment:   This is a routine wellness examination for Aimee Sharp.  Hearing/Vision screen Hearing Screening - Comments:: Denies hearing issues Vision Screening - Comments:: Regular eye exams, [facility]   Goals Addressed             This Visit's Progress    Patient Stated       06/18/2023, wants to get rid of diabetes  Depression Screen    06/18/2023    8:16 AM 06/14/2022    8:22 AM 06/09/2021    8:33 AM 06/08/2020   10:22 AM 04/22/2020   10:49 AM 02/07/2018    8:35 AM 09/13/2016   11:30 AM  PHQ 2/9 Scores  PHQ - 2 Score 0 0 0 0 0 0 0    Fall Risk    06/18/2023    8:16 AM 11/08/2022    8:22 AM 06/14/2022    8:22 AM 06/09/2021    8:33 AM 03/06/2021    8:33 AM  Fall Risk   Falls in the past year? 0 0 0 1 1  Number falls in past yr: 0 0 0 0 0  Injury with Fall? 0 0 0 1 0  Risk for fall due to : Medication side effect No Fall Risks No Fall Risks Impaired balance/gait No Fall Risks  Follow up Falls prevention discussed;Falls evaluation completed Falls evaluation completed Falls evaluation completed Falls evaluation completed     MEDICARE RISK AT HOME: Medicare Risk at Home Any stairs in or around the home?: Yes If so, are there any without handrails?: No Home free of loose throw rugs in walkways, pet beds, electrical cords, etc?: Yes Adequate lighting in your home to reduce risk of falls?: Yes Life alert?: No Use of a cane, walker or w/c?: No Grab bars in the bathroom?: Yes Shower chair or bench in shower?: No Elevated toilet seat or a handicapped toilet?: Yes  TIMED UP AND GO:  Was the test performed?  Yes  Length of time to ambulate 10 feet: 5 sec Gait steady and fast without use of assistive device    Cognitive Function:        06/18/2023    8:17 AM  6CIT Screen  What Year? 0 points  What month? 0 points   What time? 0 points  Count back from 20 0 points  Months in reverse 2 points  Repeat phrase 0 points  Total Score 2 points    Immunizations Immunization History  Administered Date(s) Administered   Fluad Quad(high Dose 65+) 03/06/2021, 04/09/2022   Fluad Trivalent(High Dose 65+) 04/19/2023   Influenza Split 06/18/2011   Influenza,inj,Quad PF,6+ Mos 02/20/2013, 06/17/2014, 02/18/2015, 02/06/2017, 02/07/2018, 02/12/2019, 04/22/2020   PFIZER Comirnaty(Gray Top)Covid-19 Tri-Sucrose Vaccine 12/20/2020   PFIZER(Purple Top)SARS-COV-2 Vaccination 08/14/2019, 09/04/2019, 02/24/2020   Pfizer Covid-19 Vaccine Bivalent Booster 59yrs & up 06/09/2021   Pfizer(Comirnaty)Fall Seasonal Vaccine 12 years and older 04/09/2022, 04/19/2023   Pneumococcal Conjugate-13 02/06/2017   Pneumococcal Polysaccharide-23 03/25/2015   Tdap 02/18/2015   Zoster Recombinant(Shingrix ) 10/22/2017, 02/07/2018   Zoster, Live 08/12/2015    TDAP status: Up to date  Flu Vaccine status: Up to date  Pneumococcal vaccine status: Up to date  Covid-19 vaccine status: Completed vaccines  Qualifies for Shingles Vaccine? Yes   Zostavax completed Yes   Shingrix  Completed?: Yes  Screening Tests Health Maintenance  Topic Date Due   FOOT EXAM  06/09/2022   Diabetic kidney evaluation - Urine ACR  12/09/2022   HEMOGLOBIN A1C  05/10/2023   COVID-19 Vaccine (8 - 2024-25 season) 06/14/2023   OPHTHALMOLOGY EXAM  08/06/2023   Diabetic kidney evaluation - eGFR measurement  11/08/2023   MAMMOGRAM  06/12/2024   Medicare Annual Wellness (AWV)  06/17/2024   DTaP/Tdap/Td (2 - Td or Tdap) 02/17/2025   Colonoscopy  02/29/2028   INFLUENZA VACCINE  Completed   DEXA SCAN  Completed   Hepatitis C Screening  Completed  Zoster Vaccines- Shingrix   Completed   HPV VACCINES  Aged Out   Pneumonia Vaccine 57+ Years old  Discontinued    Health Maintenance  Health Maintenance Due  Topic Date Due   FOOT EXAM  06/09/2022   Diabetic  kidney evaluation - Urine ACR  12/09/2022   HEMOGLOBIN A1C  05/10/2023   COVID-19 Vaccine (8 - 2024-25 season) 06/14/2023    Colorectal cancer screening: Type of screening: Colonoscopy. Completed 02/28/2018. Repeat every 10 years  Mammogram status: Completed 06/12/2022. Repeat every year  Bone Density status: Completed 12/04/2021.   Lung Cancer Screening: (Low Dose CT Chest recommended if Age 42-80 years, 20 pack-year currently smoking OR have quit w/in 15years.) does not qualify.   Lung Cancer Screening Referral: no  Additional Screening:  Hepatitis C Screening: does qualify; Completed 06/09/2021  Vision Screening: Recommended annual ophthalmology exams for early detection of glaucoma and other disorders of the eye. Is the patient up to date with their annual eye exam?  Yes  Who is the provider or what is the name of the office in which the patient attends annual eye exams? [facility] If pt is not established with a provider, would they like to be referred to a provider to establish care? No .   Dental Screening: Recommended annual dental exams for proper oral hygiene  Diabetic Foot Exam: Diabetic Foot Exam: Overdue, Pt has been advised about the importance in completing this exam. Pt is scheduled for diabetic foot exam on next appointment.  Community Resource Referral / Chronic Care Management: CRR required this visit?  No   CCM required this visit?  No     Plan:     I have personally reviewed and noted the following in the patient's chart:   Medical and social history Use of alcohol, tobacco or illicit drugs  Current medications and supplements including opioid prescriptions. Patient is not currently taking opioid prescriptions. Functional ability and status Nutritional status Physical activity Advanced directives List of other physicians Hospitalizations, surgeries, and ER visits in previous 12 months Vitals Screenings to include cognitive, depression, and  falls Referrals and appointments  In addition, I have reviewed and discussed with patient certain preventive protocols, quality metrics, and best practice recommendations. A written personalized care plan for preventive services as well as general preventive health recommendations were provided to patient.     Ardella FORBES Dawn, LPN   8/85/7974   After Visit Summary: (In Person-Printed) AVS printed and given to the patient  Nurse Notes: none

## 2023-06-18 NOTE — Patient Instructions (Signed)
 Aimee Sharp , Thank you for taking time to come for your Medicare Wellness Visit. I appreciate your ongoing commitment to your health goals. Please review the following plan we discussed and let me know if I can assist you in the future.   Referrals/Orders/Follow-Ups/Clinician Recommendations: none  This is a list of the screening recommended for you and due dates:  Health Maintenance  Topic Date Due   Complete foot exam   06/09/2022   Yearly kidney health urinalysis for diabetes  12/09/2022   Hemoglobin A1C  05/10/2023   COVID-19 Vaccine (8 - 2024-25 season) 06/14/2023   Eye exam for diabetics  08/06/2023   Yearly kidney function blood test for diabetes  11/08/2023   Mammogram  06/12/2024   Medicare Annual Wellness Visit  06/17/2024   DTaP/Tdap/Td vaccine (2 - Td or Tdap) 02/17/2025   Colon Cancer Screening  02/29/2028   Flu Shot  Completed   DEXA scan (bone density measurement)  Completed   Hepatitis C Screening  Completed   Zoster (Shingles) Vaccine  Completed   HPV Vaccine  Aged Out   Pneumonia Vaccine  Discontinued    Advanced directives: (In Chart) A copy of your advanced directives are scanned into your chart should your provider ever need it.  Next Medicare Annual Wellness Visit scheduled for next year: Yes  insert Preventive Care attachment Insert FALL PREVENTION attachment if needed

## 2023-06-20 ENCOUNTER — Ambulatory Visit: Payer: PPO | Admitting: Family Medicine

## 2023-06-20 ENCOUNTER — Encounter: Payer: Self-pay | Admitting: Family Medicine

## 2023-06-20 VITALS — BP 124/72 | HR 71 | Ht 62.0 in | Wt 134.2 lb

## 2023-06-20 DIAGNOSIS — I152 Hypertension secondary to endocrine disorders: Secondary | ICD-10-CM | POA: Diagnosis not present

## 2023-06-20 DIAGNOSIS — E1159 Type 2 diabetes mellitus with other circulatory complications: Secondary | ICD-10-CM | POA: Diagnosis not present

## 2023-06-20 DIAGNOSIS — E1140 Type 2 diabetes mellitus with diabetic neuropathy, unspecified: Secondary | ICD-10-CM

## 2023-06-20 DIAGNOSIS — E1169 Type 2 diabetes mellitus with other specified complication: Secondary | ICD-10-CM | POA: Diagnosis not present

## 2023-06-20 DIAGNOSIS — Z23 Encounter for immunization: Secondary | ICD-10-CM | POA: Diagnosis not present

## 2023-06-20 DIAGNOSIS — T383X5D Adverse effect of insulin and oral hypoglycemic [antidiabetic] drugs, subsequent encounter: Secondary | ICD-10-CM | POA: Diagnosis not present

## 2023-06-20 DIAGNOSIS — Z0000 Encounter for general adult medical examination without abnormal findings: Secondary | ICD-10-CM

## 2023-06-20 DIAGNOSIS — E785 Hyperlipidemia, unspecified: Secondary | ICD-10-CM | POA: Diagnosis not present

## 2023-06-20 LAB — POCT GLYCOSYLATED HEMOGLOBIN (HGB A1C): Hemoglobin A1C: 6.6 % — AB (ref 4.0–5.6)

## 2023-06-20 LAB — POCT UA - MICROALBUMIN
Albumin/Creatinine Ratio, Urine, POC: 29.4
Creatinine, POC: 123.6 mg/dL
Microalbumin Ur, POC: 36.3 mg/L

## 2023-06-20 MED ORDER — AMITRIPTYLINE HCL 10 MG PO TABS: 10.0000 mg | ORAL_TABLET | Freq: Every day | ORAL | 1 refills | Status: DC

## 2023-06-20 NOTE — Progress Notes (Signed)
Complete physical exam  Patient: Aimee Sharp   DOB: 03-12-1956   68 y.o. Female  MRN: [id number]  Subjective:    Chief Complaint  Patient presents with   Annual Exam    Fasting.    Leg Pain    Constant pain only at night. From middle of calf down. Right hurts worse than left.     Aimee Sharp is a 68 y.o. female who presents today for a complete physical exam. She reports consuming a general diet. Home exercise routine includes walking 1.5-2 hrs per week and line dancing. She generally feels well. She reports sleeping poorly. She complains of a several month history of right calf and left foot and toe pain.  This is interfering with sleep.  She has tried Tylenol and ibuprofen without much success.  She is taking Jardiance and Ozempic for her diabetes and doing well on that.  She takes atorvastatin as well as Avalide for her blood pressure.  She is having no difficulty with that.  She does check her blood sugars fairly regularly.  She is retired and enjoying her retirement.   Most recent fall risk assessment:    06/20/2023    1:41 PM  Fall Risk   Falls in the past year? 0  Number falls in past yr: 0  Injury with Fall? 0     Most recent depression screenings:    06/20/2023    1:41 PM 06/18/2023    8:16 AM  PHQ 2/9 Scores  PHQ - 2 Score 0 0    Vision:Within last year and Dental: No regular dental care due to dentures.     Patient Care Team: Ronnald Nian, MD as PCP - General (Family Medicine) Leitha Bleak, Milas Kocher, [facility] (Pharmacist) [facility]   Outpatient Medications Prior to Visit  Medication Sig   atorvastatin (LIPITOR) 20 MG tablet Take 1 tablet by mouth once daily   Blood Glucose Monitoring Suppl (ACCU-CHEK AVIVA PLUS) w/Device KIT Accu check Aviva plus machine. To check blood sugar 3-4 times a day. With test strips   empagliflozin (JARDIANCE) 25 MG TABS tablet Take 1 tablet (25 mg total) by mouth daily before breakfast.   glucose blood test  strip Aviva plus test strips, testing 3-4 times a day.   glucose blood test strip USE AS DIRECTED ONCE DAILY   irbesartan-hydrochlorothiazide (AVALIDE) 150-12.5 MG tablet Take 1 tablet by mouth daily.   OneTouch Delica Lancets 33G MISC Test once a daily   Semaglutide, 1 MG/DOSE, 4 MG/3ML SOPN Inject 1 mg as directed once a week.   No facility-administered medications prior to visit.    Review of Systems  All other systems reviewed and are negative.         Objective:      Physical Exam  Alert and in no distress. Tympanic membranes and canals are normal. Pharyngeal area is normal. Neck is supple without adenopathy or thyromegaly. Cardiac exam shows a regular sinus rhythm without murmurs or gallops. Lungs are clear to auscultation. Diabetic foot exam normal.  Lower extremity evaluation shows normal sensation with no erythema or induration. Hemoglobin A1c is 6.6     Assessment & Plan:    Type 2 diabetes mellitus with other specified complication, without long-term current use of insulin (HCC) - Plan: POCT UA - Microalbumin, HgB A1c  Hyperlipidemia associated with type 2 diabetes mellitus (HCC)  Hypertension associated with diabetes (HCC)  Adverse effect of metformin, subsequent encounter  Need for vaccination against Streptococcus  pneumoniae - Plan: Pneumococcal conjugate vaccine 20-valent (Prevnar 20)  Type 2 diabetes mellitus with diabetic neuropathy, without long-term current use of insulin (HCC) - Plan: amitriptyline (ELAVIL) 10 MG tablet  Immunization History  Administered Date(s) Administered   Fluad Quad(high Dose 65+) 03/06/2021, 04/09/2022   Fluad Trivalent(High Dose 65+) 04/19/2023   Influenza Split 06/18/2011   Influenza,inj,Quad PF,6+ Mos 02/20/2013, 06/17/2014, 02/18/2015, 02/06/2017, 02/07/2018, 02/12/2019, 04/22/2020   PFIZER Comirnaty(Gray Top)Covid-19 Tri-Sucrose Vaccine 12/20/2020   PFIZER(Purple Top)SARS-COV-2 Vaccination 08/14/2019, 09/04/2019,  02/24/2020   PNEUMOCOCCAL CONJUGATE-20 06/20/2023   Pfizer Covid-19 Vaccine Bivalent Booster 80yrs & up 06/09/2021   Pfizer(Comirnaty)Fall Seasonal Vaccine 12 years and older 04/09/2022, 04/19/2023   Pneumococcal Conjugate-13 02/06/2017   Pneumococcal Polysaccharide-23 03/25/2015   Tdap 02/18/2015   Zoster Recombinant(Shingrix) 10/22/2017, 02/07/2018   Zoster, Live 08/12/2015    Health Maintenance  Topic Date Due   FOOT EXAM  06/09/2022   COVID-19 Vaccine (8 - 2024-25 season) 06/14/2023   OPHTHALMOLOGY EXAM  08/06/2023   Diabetic kidney evaluation - eGFR measurement  11/08/2023   HEMOGLOBIN A1C  12/18/2023   MAMMOGRAM  06/12/2024   Medicare Annual Wellness (AWV)  06/17/2024   Diabetic kidney evaluation - Urine ACR  06/19/2024   DTaP/Tdap/Td (2 - Td or Tdap) 02/17/2025   Colonoscopy  02/29/2028   INFLUENZA VACCINE  Completed   DEXA SCAN  Completed   Hepatitis C Screening  Completed   Zoster Vaccines- Shingrix  Completed   HPV VACCINES  Aged Out   Pneumonia Vaccine 28+ Years old  Discontinued    Discussed health benefits of physical activity, and encouraged her to engage in regular exercise appropriate for her age and condition.  Problem List Items Addressed This Visit     Hyperlipidemia associated with type 2 diabetes mellitus (HCC)   Hypertension associated with diabetes (HCC)   Metformin adverse reaction   Type 2 diabetes mellitus with other specified complication (HCC) - Primary   Relevant Orders   POCT UA - Microalbumin (Completed)   HgB A1c (Completed)   Other Visit Diagnoses       Need for vaccination against Streptococcus pneumoniae       Relevant Orders   Pneumococcal conjugate vaccine 20-valent (Prevnar 20) (Completed)     Type 2 diabetes mellitus with diabetic neuropathy, without long-term current use of insulin (HCC)       Relevant Medications   amitriptyline (ELAVIL) 10 MG tablet     I will try her on amitriptyline to see if that will help with the  symptoms.  I was reluctant to say this is neuropathy but certainly could be.  She was comfortable with that approach. Return in about 6 months (around 12/18/2023).     Sharlot Gowda, MD

## 2023-07-28 ENCOUNTER — Other Ambulatory Visit: Payer: Self-pay | Admitting: Family Medicine

## 2023-07-28 DIAGNOSIS — I152 Hypertension secondary to endocrine disorders: Secondary | ICD-10-CM

## 2023-07-29 ENCOUNTER — Telehealth: Payer: Self-pay

## 2023-07-29 NOTE — Telephone Encounter (Signed)
 Spoke with patient. States he last dose is 08/05/23 and will need a refill by 08/12/23.

## 2023-07-29 NOTE — Telephone Encounter (Signed)
 Copied from CRM [id number]. Topic: Clinical - Prescription Issue >> Jul 29, 2023 11:58 AM Shelah Lewandowsky wrote: Reason for CRM: Checking on Ozempic refill, please call [phone]- will be out after Monday

## 2023-07-30 ENCOUNTER — Encounter: Payer: Self-pay | Admitting: Internal Medicine

## 2023-07-31 ENCOUNTER — Telehealth: Payer: Self-pay

## 2023-07-31 DIAGNOSIS — E1169 Type 2 diabetes mellitus with other specified complication: Secondary | ICD-10-CM

## 2023-07-31 MED ORDER — SEMAGLUTIDE (1 MG/DOSE) 4 MG/3ML ~~LOC~~ SOPN: 1.0000 mg | PEN_INJECTOR | SUBCUTANEOUS | 0 refills | Status: DC

## 2023-07-31 NOTE — Progress Notes (Signed)
   07/31/2023  Patient ID: Aimee Sharp, female   DOB: 13-Sep-1955, 68 y.o.   MRN: [id number]  Patient's Ozempic shipment through [employer] [facility] has been delayed. Was able to secure a 1 month voucher for patient to be redeemed at local pharmacy. Voucher Info Listed BelowJohnney Killian: [id number] PCN: [id number] Group:[id number] ID: [id number]  Sent to patient's preferred pharmacy. Patient aware.  Sherrill Raring, PharmD Clinical Pharmacist [phone]

## 2023-08-01 ENCOUNTER — Other Ambulatory Visit: Payer: Self-pay | Admitting: Family Medicine

## 2023-08-01 DIAGNOSIS — Z1231 Encounter for screening mammogram for malignant neoplasm of breast: Secondary | ICD-10-CM

## 2023-08-01 NOTE — Telephone Encounter (Signed)
 Patient's Ozempic shipment through [employer] [facility] has been delayed. Aimee Sharp was able to secure a 1 month voucher for patient to be redeemed at local pharmacy.

## 2023-08-07 ENCOUNTER — Other Ambulatory Visit: Payer: Self-pay | Admitting: Family Medicine

## 2023-08-07 DIAGNOSIS — E1169 Type 2 diabetes mellitus with other specified complication: Secondary | ICD-10-CM

## 2023-08-07 NOTE — Telephone Encounter (Signed)
 Copied from CRM [id number]. Topic: Clinical - Medication Refill >> Aug 07, 2023  1:18 PM Everette C wrote: Most Recent Primary Care Visit:  Provider: Ronnald Nian  Department: [facility]  Visit Type: MEDICARE WELL VISIT 45  Date: 06/20/2023  Medication: Semaglutide, 1 MG/DOSE, 4 MG/3ML SOPN [[id number]]  Has the patient contacted their pharmacy? Yes (Agent: If no, request that the patient contact the pharmacy for the refill. If patient does not wish to contact the pharmacy document the reason why and proceed with request.) (Agent: If yes, when and what did the pharmacy advise?)  Is this the correct pharmacy for this prescription? Yes If no, delete pharmacy and type the correct one.  This is the patient's preferred pharmacy:  [facility] [address] ([address] [place] - [address] [place] ([place]) [place] [address] Phone: [phone] Fax: [phone]  Has the prescription been filled recently? Yes  Is the patient out of the medication? Yes  Has the patient been seen for an appointment in the last year OR does the patient have an upcoming appointment? Yes  Can we respond through MyChart? No  Agent: Please be advised that Rx refills may take up to 3 business days. We ask that you follow-up with your pharmacy.

## 2023-08-08 DIAGNOSIS — E119 Type 2 diabetes mellitus without complications: Secondary | ICD-10-CM | POA: Diagnosis not present

## 2023-08-08 DIAGNOSIS — Z961 Presence of intraocular lens: Secondary | ICD-10-CM | POA: Diagnosis not present

## 2023-08-08 LAB — HM DIABETES EYE EXAM

## 2023-08-12 ENCOUNTER — Telehealth: Payer: Self-pay | Admitting: Internal Medicine

## 2023-08-12 NOTE — Telephone Encounter (Signed)
 Copied from CRM [id number]. Topic: Clinical - Prescription Issue >> Aug 12, 2023 10:07 AM Gery Pray wrote: Reason for CRM: Patient calling stating that pharmacy has not received the voucher for her Ozempic medication. Patient states that she went over on 03/08. Patient states the only pharmacy she uses it the [place] pharmacy. Patient can be contacted at [phone]

## 2023-08-12 NOTE — Telephone Encounter (Signed)
 Contacted [facility] and provided voucher info and they verbally confirmed Ozempic 1mg  goes through for $0 copay. Notified patient and she will pick up today.

## 2023-08-15 NOTE — Telephone Encounter (Signed)
 PAP Ozempic received,  called pt and informed

## 2023-08-16 ENCOUNTER — Ambulatory Visit
Admission: RE | Admit: 2023-08-16 | Discharge: 2023-08-16 | Disposition: A | Discharge status: Home / Self Care | Payer: PPO | Source: Ambulatory Visit | Attending: Family Medicine | Admitting: Family Medicine

## 2023-08-16 DIAGNOSIS — Z1231 Encounter for screening mammogram for malignant neoplasm of breast: Secondary | ICD-10-CM

## 2023-10-24 ENCOUNTER — Other Ambulatory Visit: Payer: Self-pay | Admitting: Family Medicine

## 2023-10-24 DIAGNOSIS — E1169 Type 2 diabetes mellitus with other specified complication: Secondary | ICD-10-CM

## 2023-11-20 NOTE — Telephone Encounter (Signed)
 PAP Ozempic received, pt informed

## 2023-12-04 ENCOUNTER — Other Ambulatory Visit: Payer: Self-pay | Admitting: Family Medicine

## 2023-12-04 MED ORDER — GLUCOSE BLOOD VI STRP: ORAL_STRIP | 1 refills | Status: AC

## 2023-12-04 NOTE — Telephone Encounter (Unsigned)
 Copied from CRM [id number]. Topic: Clinical - Medication Refill >> Dec 04, 2023  9:29 AM Travis F wrote: Medication: onetouch verio flex test strips  Has the patient contacted their pharmacy? Yes  (Agent: If yes, when and what did the pharmacy advise?)  This is the patient's preferred pharmacy:  [facility] [address] ([address]), [place] - [address] [place] ([place]) [place] [address] Phone: [phone] Fax: [phone]  Is this the correct pharmacy for this prescription? Yes  Has the prescription been filled recently? Yes  Is the patient out of the medication? Yes  Has the patient been seen for an appointment in the last year OR does the patient have an upcoming appointment? Yes  Can we respond through MyChart? No  Agent: Please be advised that Rx refills may take up to 3 business days. We ask that you follow-up with your pharmacy.

## 2023-12-19 ENCOUNTER — Encounter: Payer: Self-pay | Admitting: Family Medicine

## 2023-12-19 ENCOUNTER — Ambulatory Visit (INDEPENDENT_AMBULATORY_CARE_PROVIDER_SITE_OTHER): Payer: PPO | Admitting: Family Medicine

## 2023-12-19 VITALS — BP 118/76 | HR 82 | Wt 132.4 lb

## 2023-12-19 DIAGNOSIS — E1159 Type 2 diabetes mellitus with other circulatory complications: Secondary | ICD-10-CM | POA: Diagnosis not present

## 2023-12-19 DIAGNOSIS — E785 Hyperlipidemia, unspecified: Secondary | ICD-10-CM | POA: Diagnosis not present

## 2023-12-19 DIAGNOSIS — M79661 Pain in right lower leg: Secondary | ICD-10-CM

## 2023-12-19 DIAGNOSIS — E1169 Type 2 diabetes mellitus with other specified complication: Secondary | ICD-10-CM | POA: Diagnosis not present

## 2023-12-19 DIAGNOSIS — I152 Hypertension secondary to endocrine disorders: Secondary | ICD-10-CM

## 2023-12-19 DIAGNOSIS — T383X5D Adverse effect of insulin and oral hypoglycemic [antidiabetic] drugs, subsequent encounter: Secondary | ICD-10-CM | POA: Diagnosis not present

## 2023-12-19 LAB — POCT GLYCOSYLATED HEMOGLOBIN (HGB A1C): Hemoglobin A1C: 6.2 % — AB (ref 4.0–5.6)

## 2023-12-19 LAB — LIPID PANEL

## 2023-12-19 MED ORDER — NORTRIPTYLINE HCL 10 MG PO CAPS: 10.0000 mg | ORAL_CAPSULE | Freq: Every day | ORAL | 1 refills | Status: DC

## 2023-12-19 NOTE — Patient Instructions (Signed)
 Try the new medicine and let me know how you are doing on it.  You can continue to use the Tylenol  and do some stretching before you go to bed

## 2023-12-19 NOTE — Progress Notes (Signed)
   Subjective:    Patient ID: Aimee Sharp, female    DOB: [birth year], 68 y.o.   MRN: [id number]  HPI She is here for an interval evaluation.  She continues have difficulty with right calf discomfort.  This does not bother her during the day when she is quite physically active and seems only bother her at night when she lays down.  She does not smoke.  This discomfort is not associated with any physical activity.  She does take Tylenol  which she states does help for a few hours.  She tried amitriptyline  after her last visit however it made her too drowsy and she stopped taking it.  She continues on her blood pressure medication.  She is also taking Ozempic  and Jardiance  and having no difficulty with that.  She has been checking her blood sugar sometimes 3 times per day.   Review of Systems     Objective:    Physical Exam Alert and in no distress.  Exam of her lower extremities shows normal sensation reflexes and pulses.  No palpable tenderness to the calf muscle.  Normal strength.  Hemoglobin A1c is 6.5.       Assessment & Plan:  Type 2 diabetes mellitus with other specified complication, without long-term current use of insulin  (HCC) - Plan: CBC with Differential/Platelet, Comprehensive metabolic panel with GFR, Lipid panel, POCT glycosylated hemoglobin (Hb A1C)  Hyperlipidemia associated with type 2 diabetes mellitus (HCC) - Plan: Lipid panel  Hypertension associated with diabetes (HCC) - Plan: CBC with Differential/Platelet, Comprehensive metabolic panel with GFR  Adverse effect of metformin , subsequent encounter  Right calf pain - Plan: nortriptyline  (PAMELOR ) 10 MG capsule Difficult to say what is exactly causing this calf pain.  Does not seem to be vascular or neurologic in nature but I think is worthwhile trying nortriptyline  to see if this helps with that as she did not tolerate the amitriptyline .  She will keep me informed concerning this.  Encouraged her to check her blood  sugars daily rather than 3 times per day either before meal or 2 hours after a meal.

## 2023-12-20 ENCOUNTER — Ambulatory Visit: Payer: Self-pay | Admitting: Family Medicine

## 2023-12-20 LAB — COMPREHENSIVE METABOLIC PANEL WITH GFR
ALT: 15 IU/L (ref 0–32)
AST: 19 IU/L (ref 0–40)
Albumin: 4.3 g/dL (ref 3.9–4.9)
Alkaline Phosphatase: 145 IU/L — ABNORMAL HIGH (ref 44–121)
BUN/Creatinine Ratio: 12 (ref 12–28)
BUN: 10 mg/dL (ref 8–27)
Bilirubin Total: 0.5 mg/dL (ref 0.0–1.2)
CO2: 24 mmol/L (ref 20–29)
Calcium: 9.2 mg/dL (ref 8.7–10.3)
Chloride: 103 mmol/L (ref 96–106)
Creatinine, Ser: 0.82 mg/dL (ref 0.57–1.00)
Globulin, Total: 2.9 g/dL (ref 1.5–4.5)
Glucose: 105 mg/dL — ABNORMAL HIGH (ref 70–99)
Potassium: 3.2 mmol/L — ABNORMAL LOW (ref 3.5–5.2)
Sodium: 143 mmol/L (ref 134–144)
Total Protein: 7.2 g/dL (ref 6.0–8.5)
eGFR: 78 mL/min/1.73 (ref >59)

## 2023-12-20 LAB — CBC WITH DIFFERENTIAL/PLATELET
Basophils Absolute: 0 x10E3/uL (ref 0.0–0.2)
Basos: 0 %
EOS (ABSOLUTE): 0.1 x10E3/uL (ref 0.0–0.4)
Eos: 3 %
Hematocrit: 35.9 % (ref 34.0–46.6)
Hemoglobin: 11.3 g/dL (ref 11.1–15.9)
Immature Grans (Abs): 0 x10E3/uL (ref 0.0–0.1)
Immature Granulocytes: 0 %
Lymphocytes Absolute: 2.1 x10E3/uL (ref 0.7–3.1)
Lymphs: 42 %
MCH: 29.3 pg (ref 26.6–33.0)
MCHC: 31.5 g/dL (ref 31.5–35.7)
MCV: 93 fL (ref 79–97)
Monocytes Absolute: 0.4 x10E3/uL (ref 0.1–0.9)
Monocytes: 7 %
Neutrophils Absolute: 2.4 x10E3/uL (ref 1.4–7.0)
Neutrophils: 48 %
Platelets: 246 x10E3/uL (ref 150–450)
RBC: 3.86 x10E6/uL (ref 3.77–5.28)
RDW: 13 % (ref 11.7–15.4)
WBC: 5.1 x10E3/uL (ref 3.4–10.8)

## 2023-12-20 LAB — LIPID PANEL
Chol/HDL Ratio: 2.9 ratio (ref 0.0–4.4)
Cholesterol, Total: 106 mg/dL (ref 100–199)
HDL: 37 mg/dL — ABNORMAL LOW (ref >39)
LDL Chol Calc (NIH): 50 mg/dL (ref 0–99)
Triglycerides: 100 mg/dL (ref 0–149)
VLDL Cholesterol Cal: 19 mg/dL (ref 5–40)

## 2024-01-01 ENCOUNTER — Other Ambulatory Visit: Payer: Self-pay | Admitting: Family Medicine

## 2024-01-01 DIAGNOSIS — E1159 Type 2 diabetes mellitus with other circulatory complications: Secondary | ICD-10-CM

## 2024-01-21 ENCOUNTER — Other Ambulatory Visit: Payer: Self-pay | Admitting: Family Medicine

## 2024-01-21 DIAGNOSIS — E1169 Type 2 diabetes mellitus with other specified complication: Secondary | ICD-10-CM

## 2024-01-21 DIAGNOSIS — E1159 Type 2 diabetes mellitus with other circulatory complications: Secondary | ICD-10-CM

## 2024-02-06 ENCOUNTER — Telehealth: Payer: Self-pay

## 2024-02-06 NOTE — Progress Notes (Signed)
   02/06/2024  Patient ID: Aimee Sharp, female   DOB: 1956-04-12, 68 y.o.   MRN: [id number]  Received fax request from [employer] for Ozempic  refill.  Faxed in order for 4 month supply.  Jon VEAR Lindau, PharmD Clinical Pharmacist [phone]

## 2024-02-11 ENCOUNTER — Other Ambulatory Visit: Payer: Self-pay | Admitting: Family Medicine

## 2024-02-11 DIAGNOSIS — M79661 Pain in right lower leg: Secondary | ICD-10-CM

## 2024-02-12 NOTE — Telephone Encounter (Signed)
 PAP Ozempic received, left message for pt

## 2024-03-09 ENCOUNTER — Other Ambulatory Visit: Payer: Self-pay | Admitting: Family Medicine

## 2024-03-09 DIAGNOSIS — M79661 Pain in right lower leg: Secondary | ICD-10-CM

## 2024-03-20 ENCOUNTER — Telehealth: Payer: Self-pay | Admitting: Family Medicine

## 2024-03-20 NOTE — Telephone Encounter (Signed)
 Pt got letter regarding discontinuation of PAP for Ozempic , she doesn't use Mychart, I explained and gave her # to call to discuss insurances with [employer]

## 2024-04-06 ENCOUNTER — Other Ambulatory Visit: Payer: Self-pay | Admitting: Family Medicine

## 2024-04-06 DIAGNOSIS — M79661 Pain in right lower leg: Secondary | ICD-10-CM

## 2024-04-08 ENCOUNTER — Other Ambulatory Visit

## 2024-04-08 DIAGNOSIS — Z23 Encounter for immunization: Secondary | ICD-10-CM | POA: Diagnosis not present

## 2024-04-09 ENCOUNTER — Other Ambulatory Visit

## 2024-04-09 DIAGNOSIS — E1169 Type 2 diabetes mellitus with other specified complication: Secondary | ICD-10-CM

## 2024-04-09 NOTE — Progress Notes (Signed)
 04/09/2024 Name: Aimee Sharp MRN: [id number] DOB: 09-19-1955  Chief Complaint  Patient presents with   Medication Management   Diabetes   Medication Assistance    Aimee Sharp is a 68 y.o. year old female who presented for a telephone visit.   They were referred to the pharmacist by patient reqeust for assistance in managing medication access.    Subjective:  Care Team: Primary Care Provider: Joyce Norleen BROCKS, MD   Medication Access/Adherence  Current Pharmacy:  [facility] [address]), [place] - [address] [place] [address] Phone: [phone] Fax: [phone]  [facility] [place], [place] - [address] [place] [address] Phone: [phone] Fax: [phone]  [facility] - [place], [place] - [address] [place] [place] [address] Phone: [phone] Fax: [phone]   Patient reports affordability concerns with their medications: Yes  - worried about how she will be able to stay on Ozempic  for 2026 with the assistance program ending Patient reports access/transportation concerns to their pharmacy: No  Patient reports adherence concerns with their medications:  No     Medication Access -Patient aware that Ozempic  program is ending and would like to try to stay on medication if possible -Is interested in resources for insurance to determine plan best for her that may also help her maintain access to Ozempic  -Needs to renew Jardiance  PAP for 2026, would like to come in office to complete paperwork  Objective:  Lab Results  Component Value Date   HGBA1C 6.2 (A) 12/19/2023    Lab Results  Component Value Date   CREATININE 0.82 12/19/2023   BUN 10 12/19/2023   NA 143 12/19/2023   K 3.2 (L) 12/19/2023   CL 103 12/19/2023   CO2 24 12/19/2023    Lab Results  Component Value Date   CHOL 106 12/19/2023   HDL 37 (L) 12/19/2023    LDLCALC 50 12/19/2023   TRIG 100 12/19/2023   CHOLHDL 2.9 12/19/2023    Medications Reviewed Today     Reviewed by Lionell Jon DEL, RPH (Pharmacist) on 04/09/24 at 1124  Med List Status: <None>   Medication Order Taking? Sig Documenting Provider Last Dose Status Informant  atorvastatin  (LIPITOR) 20 MG tablet [id number]  Take 1 tablet by mouth once daily Lalonde, John C, MD  Active   Blood Glucose Monitoring Suppl (ACCU-CHEK AVIVA PLUS) w/Device KIT [id number]  Accu check Aviva plus machine. To check blood sugar 3-4 times a day. With test strips Joyce Norleen BROCKS, MD  Active   glucose blood test strip [id number]  Aviva plus test strips, testing 3-4 times a day. Joyce Norleen BROCKS, MD  Active   glucose blood test strip [id number]  USE AS DIRECTED ONCE DAILY Lalonde, John C, MD  Active   irbesartan -hydrochlorothiazide  (AVALIDE) 150-12.5 MG tablet [id number]  Take 1 tablet by mouth once daily Lalonde, John C, MD  Active   JARDIANCE  25 MG TABS tablet [id number]  TAKE ONE TABLET BY MOUTH DAILY Lalonde, John C, MD  Active   nortriptyline  (PAMELOR ) 10 MG capsule [id number]  Take 1 capsule by mouth at bedtime Lalonde, John C, MD  Active   [facility] Lancets 33G [place] [id number]  Test once a daily Bulah Alm GORMAN DEVONNA  Active   Semaglutide , 1 MG/DOSE, 4 MG/3ML SOPN [id number]  Inject 1 mg as directed once a week. Use voucher not insurance BIN [phone] PCN [id number]  Group [id number] ID [id number] Joyce Norleen BROCKS, MD  Active               Assessment/Plan:   Medication Access -Provided resources to [facility] for access to medicare plan enrollment and reminded that open enrollment ends 05/10/24 -Prepared jardiance  renewal app, patient plans to come by to sign today -Screened for LIS, does not qualify -Setup follow up for Jan to assess if Ozempic  will be affordable -Placed referral for social work at patient request to help get resources for financial strain and food insecurity   Follow Up Plan: 2  months  Jon VEAR Lindau, PharmD Clinical Pharmacist [phone]

## 2024-04-16 ENCOUNTER — Telehealth: Payer: Self-pay

## 2024-04-16 NOTE — Progress Notes (Signed)
   04/16/2024  Patient ID: Aimee Sharp, female   DOB: [birth year], 68 y.o.   MRN: [id number]  Patient completed her portion of the [facility] [employer] Jardiance  renewal application for 2026. Submitted completed full application to company, pending response.  Jon VEAR Lindau, PharmD Clinical Pharmacist [phone]

## 2024-05-01 NOTE — Telephone Encounter (Signed)
 PAP: Patient assistance application for Jardiance  has been approved by PAP Companies: [employer] from 06/04/2024 to 06/03/2025. Medication should be delivered to PAP Delivery: Home. For further shipping updates, please contact [employer] ([employer]) at [phone]. Patient ID is: [id number]

## 2024-05-02 ENCOUNTER — Emergency Department (HOSPITAL_BASED_OUTPATIENT_CLINIC_OR_DEPARTMENT_OTHER)

## 2024-05-02 ENCOUNTER — Encounter (HOSPITAL_BASED_OUTPATIENT_CLINIC_OR_DEPARTMENT_OTHER): Payer: Self-pay

## 2024-05-02 ENCOUNTER — Emergency Department (HOSPITAL_BASED_OUTPATIENT_CLINIC_OR_DEPARTMENT_OTHER)
Admission: EM | Admit: 2024-05-02 | Discharge: 2024-05-02 | Disposition: A | Discharge status: Home / Self Care | Attending: Emergency Medicine | Admitting: Emergency Medicine

## 2024-05-02 DIAGNOSIS — M7989 Other specified soft tissue disorders: Secondary | ICD-10-CM | POA: Insufficient documentation

## 2024-05-02 DIAGNOSIS — Z7984 Long term (current) use of oral hypoglycemic drugs: Secondary | ICD-10-CM | POA: Diagnosis not present

## 2024-05-02 DIAGNOSIS — E119 Type 2 diabetes mellitus without complications: Secondary | ICD-10-CM | POA: Insufficient documentation

## 2024-05-02 DIAGNOSIS — Z79899 Other long term (current) drug therapy: Secondary | ICD-10-CM | POA: Diagnosis not present

## 2024-05-02 DIAGNOSIS — Z794 Long term (current) use of insulin: Secondary | ICD-10-CM | POA: Insufficient documentation

## 2024-05-02 DIAGNOSIS — I10 Essential (primary) hypertension: Secondary | ICD-10-CM | POA: Insufficient documentation

## 2024-05-02 DIAGNOSIS — M7731 Calcaneal spur, right foot: Secondary | ICD-10-CM | POA: Diagnosis not present

## 2024-05-02 DIAGNOSIS — M79671 Pain in right foot: Secondary | ICD-10-CM | POA: Diagnosis present

## 2024-05-02 DIAGNOSIS — W1840XA Slipping, tripping and stumbling without falling, unspecified, initial encounter: Secondary | ICD-10-CM | POA: Insufficient documentation

## 2024-05-02 LAB — CBG MONITORING, ED: Glucose-Capillary: 74 mg/dL (ref 70–99)

## 2024-05-02 MED ORDER — KETOROLAC TROMETHAMINE 15 MG/ML IJ SOLN
15.0000 mg | Freq: Once | INTRAMUSCULAR | Status: AC
  Administered 2024-05-02: 15 mg via INTRAMUSCULAR
  Filled 2024-05-02: qty 1

## 2024-05-02 NOTE — ED Notes (Signed)
 Reviewed discharge instructions and home care with pt. Pt verbalized understanding and had no further questions. Pt exited ED without complications.

## 2024-05-02 NOTE — ED Triage Notes (Signed)
 Pt reports R foot pain since Wednesday. Denies fall/trauma. Does report she 'missed a step' but denies rolling ankle/fall. Pain ongoing and worsening. Difficulty bearing weight and ambulating. Hx diabetes. Denies calf pain/tenderness. Mild swelling, endorses intermittent numbness/tingling.

## 2024-05-02 NOTE — ED Notes (Signed)
 CBG 74

## 2024-05-02 NOTE — Discharge Instructions (Addendum)
 It was a pleasure taking care of you today. You were seen in the Emergency Department for right foot pain. Your work-up was reassuring. Your x-ray shows no evidence of fracture or dislocation.  I suspect your symptoms are likely secondary to a foot sprain.  As discussed, I recommend minimizing movement for the next few days until your swelling improves.  You may continue to take Tylenol  every 6 hours as needed for pain.  Due to your diabetes, if you are going to take NSAIDs, I recommend taking naproxen as it has the least amount of cardiovascular risk.  You may take this medication every 12 hours as needed for pain and inflammation.  Additionally, continue to ice and elevate your foot.  I have placed you in a temporary brace for your comfort and have provided you with crutches. Refer to the attached documentation for further management of your symptoms. Follow up with your PCP if your symptoms worsen.  Please return to the ER if you experience chest pain, trouble breathing, intractable nausea/vomiting or any other life threatening illnesses.

## 2024-05-02 NOTE — ED Provider Notes (Signed)
 [place] EMERGENCY DEPARTMENT AT [facility] Provider Note   CSN: [id number] Arrival date & time: 05/02/24  1241     Patient presents with: No chief complaint on file.   Aimee Sharp is a 68 y.o. female with past medical history of diabetes, hypertension, hyperlipidemia who presents emergency department for evaluation of right foot pain and swelling.  Patient reports approximately 1 week ago she tripped over a stair.  She states she did not fall and she was able to catch herself.  However, she has been having throbbing pain since that time that has been gradually worsening.  Patient reports the pain was so bad yesterday that she has been unable to bear weight since that time.  She has been taking Tylenol  as recommended by her doctor and using ice as well as elevation.  However, patient continues to report pain and swelling.  Prior to this incident, patient was active and reports walking at the gym daily.  HPI     Prior to Admission medications   Medication Sig Start Date End Date Taking? Authorizing Provider  atorvastatin  (LIPITOR) 20 MG tablet Take 1 tablet by mouth once daily 01/21/24   Lalonde, John C, MD  Blood Glucose Monitoring Suppl (ACCU-CHEK AVIVA PLUS) w/Device KIT Accu check Aviva plus machine. To check blood sugar 3-4 times a day. With test strips 08/28/18   Joyce Norleen BROCKS, MD  glucose blood test strip Aviva plus test strips, testing 3-4 times a day. 08/28/18   Joyce Norleen BROCKS, MD  glucose blood test strip USE AS DIRECTED ONCE DAILY 12/04/23   Joyce Norleen BROCKS, MD  irbesartan -hydrochlorothiazide  (AVALIDE) 150-12.5 MG tablet Take 1 tablet by mouth once daily 01/21/24   Lalonde, John C, MD  JARDIANCE  25 MG TABS tablet TAKE ONE TABLET BY MOUTH DAILY 01/01/24   Joyce Norleen BROCKS, MD  nortriptyline  (PAMELOR ) 10 MG capsule Take 1 capsule by mouth at bedtime 04/07/24   Lalonde, John C, MD  OneTouch [employer] Lancets 33G MISC Test once a daily 01/28/23   Tysinger, Alm RAMAN, PA-C   Semaglutide , 1 MG/DOSE, 4 MG/3ML SOPN Inject 1 mg as directed once a week. Use voucher not insurance BIN [id number] PCN [id number] Group [id number] ID [id number] 07/31/23   Joyce Norleen BROCKS, MD    Allergies: Patient has no known allergies.    Review of Systems  Musculoskeletal:  Positive for joint swelling.    Updated Vital Signs BP (!) 150/89   Pulse 77   Temp 98.4 F (36.9 C)   Resp 20   Ht 5' 2 (1.575 m)   Wt 61.2 kg   SpO2 99%   BMI 24.69 kg/m   Physical Exam Vitals and nursing note reviewed.  Constitutional:      Appearance: Normal appearance. She is not ill-appearing.  Eyes:     General: No scleral icterus. Pulmonary:     Effort: Pulmonary effort is normal. No respiratory distress.  Musculoskeletal:        General: Swelling and tenderness present. No deformity.     Comments: Patient with right dorsal foot and lateral foot swelling.  Patient is able to flex and extend her ankle, although has some difficulty with extension secondary to pain.  She is able to invert and evert her foot, although has pain doing so.  Her strength and sensation is intact.  DP pulse 2+.  Skin:    Coloration: Skin is not jaundiced.  Neurological:     General: No focal deficit present.  Mental Status: She is alert.  Psychiatric:        Mood and Affect: Mood normal.     (all labs ordered are listed, but only abnormal results are displayed) Labs Reviewed  CBG MONITORING, ED    EKG: None  Radiology: DG Foot Complete Right Result Date: 05/02/2024 EXAM: 3 or more VIEW(S) XRAY OF THE RIGHT FOOT 05/02/2024 01:30:03 PM COMPARISON: Comparison 08/05/2014. CLINICAL HISTORY: [id number] Foot injury, left, initial encounter [id number] Foot injury, left, initial encounter. FINDINGS: BONES AND JOINTS: No acute fracture. No focal osseous lesion. No joint dislocation. SOFT TISSUES: The soft tissues are unremarkable. IMPRESSION: 1. No acute fracture or dislocation. 2. Moderate posterior calcaneal spurring.  Electronically signed by: Lynwood Seip MD 05/02/2024 01:52 PM EST RP Workstation: [id number]    Procedures   Medications Ordered in the ED  ketorolac (TORADOL) 15 MG/ML injection 15 mg (has no administration in time range)                                  Medical Decision Making Amount and/or Complexity of Data Reviewed Radiology: ordered.  Risk Prescription drug management.   This patient presents to the ED for concern of right foot pain, this involves an extensive number of treatment options, and is a complaint that carries with it a high risk of complications and morbidity.   Differential diagnosis includes: Fracture, Lisfranc injury, dislocation, sprain, strain, hematoma, compartment syndrome  Co morbidities:  diabetes, hypertension, hyperlipidemia  Lab Tests:  I Ordered, and personally interpreted labs.  The pertinent results include:    - CBG: 74  Imaging Studies:  I ordered imaging studies including right foot x-ray I independently visualized and interpreted imaging which showed no evidence of fracture or dislocation I agree with the radiologist interpretation  Cardiac Monitoring/ECG:  The patient was maintained on a cardiac monitor.  I personally viewed and interpreted the cardiac monitored which showed an underlying rhythm of: Sinus rhythm  Medicines ordered and prescription drug management:  I ordered medication including  Medications  ketorolac (TORADOL) 15 MG/ML injection 15 mg (has no administration in time range)   for pain and inflammation control Reevaluation of the patient after these medicines showed that the patient improved I have reviewed the patients home medicines and have made adjustments as needed  Test Considered:   none  Critical Interventions:   none  Consultations Obtained: None  Problem List / ED Course:     ICD-10-CM   1. Right foot pain  M79.671       MDM: 68 year old female who presents emergency department for  evaluation of right foot pain and swelling.  Patient reports she missed a step last week but did not fall.  She denies rolling or twisting her ankle.  On physical exam, there is swelling and tenderness noted to the dorsal midfoot and lateral midfoot.  Strength and sensation is intact.  Some right-sided foot strength is mildly decreased secondary to pain.  Patient has been taking Tylenol  at home for pain as well as icing and elevating the foot.  At this time, patient states she is unable to bear weight.  Right foot x-ray shows no evidence of fracture or dislocation.  However, due to the patient being unable to bear weight, I have ordered a foot splint and advised crutches instruction.  Patient is also a known diabetic and so should minimize her use of NSAIDs.  I have provided a recommendation  for patient to continue taking her Tylenol  as well as using the naproxen as her NSAID of choice due to its lower association with cardiovascular risk compared to ibuprofen .  In the emergency department, I did order a shot of Toradol for pain control.  I advised patient to minimize movement and bearing weight on the extremity until the swelling and pain are improved.  I recommended patient continue utilizing ice and elevation in addition to medication and splint placement.  Patient verbalized her understanding to this.  I suggested patient follow-up with her PCP if her symptoms are not improving in the next few days.  Patient vital signs are stable.  Patient is appropriate for discharge at this time.   Dispostion:  After consideration of the diagnostic results and the patients response to treatment, I feel that the patient would benefit from supportive care and PCP follow-up for worsening symptoms.    Final diagnoses:  Right foot pain    ED Discharge Orders     None          Torrence Marry GORMAN DEVONNA 05/03/24 0001    Armenta Canning, MD 05/03/24 1850

## 2024-05-04 ENCOUNTER — Other Ambulatory Visit: Payer: Self-pay | Admitting: Family Medicine

## 2024-05-04 ENCOUNTER — Ambulatory Visit: Admitting: Family Medicine

## 2024-05-04 ENCOUNTER — Encounter: Payer: Self-pay | Admitting: Family Medicine

## 2024-05-04 VITALS — BP 146/86 | HR 77 | Wt 128.6 lb

## 2024-05-04 DIAGNOSIS — M79661 Pain in right lower leg: Secondary | ICD-10-CM

## 2024-05-04 DIAGNOSIS — Z7984 Long term (current) use of oral hypoglycemic drugs: Secondary | ICD-10-CM

## 2024-05-04 DIAGNOSIS — M79671 Pain in right foot: Secondary | ICD-10-CM | POA: Diagnosis not present

## 2024-05-04 DIAGNOSIS — E119 Type 2 diabetes mellitus without complications: Secondary | ICD-10-CM | POA: Diagnosis not present

## 2024-05-04 DIAGNOSIS — E1140 Type 2 diabetes mellitus with diabetic neuropathy, unspecified: Secondary | ICD-10-CM | POA: Diagnosis not present

## 2024-05-04 MED ORDER — MELOXICAM 15 MG PO TABS: 15.0000 mg | ORAL_TABLET | Freq: Every day | ORAL | 0 refills | Status: DC

## 2024-05-04 NOTE — Progress Notes (Signed)
   Name: Aimee Sharp   Date of Visit: 05/04/24   Date of last visit with me: 03/09/2024   CHIEF COMPLAINT:  Chief Complaint  Patient presents with   Hospitalization Follow-up    Hospital follow up, tripped a while back, pain came back started swelling, went to ED on Friday.        HPI:  Discussed the use of AI scribe software for clinical note transcription with the patient, who gave verbal consent to proceed.  History of Present Illness   Aimee Sharp is a 68 year old female with diabetes who presents with persistent foot pain following a fall. She is accompanied by her twin sister.  She has been experiencing persistent pain in her foot following a fall approximately one week ago. She missed a step and does not recall the exact mechanism of the injury. Since the fall, she experiences random flare-ups of sharp pain and is unable to put pressure on the bottom of her foot. The pain is accompanied by tenderness in the affected area. No history of gout, warmth, or redness is noted.  She received a recent x-ray. She has been using Tylenol  for pain management but notes that it has not been effective. She was also given a Toradol injection for pain relief.  She has a history of diabetes, which is currently managed with Jardiance  and Ozempic . She reports significant weight loss since starting these medications. She reports that her blood pressure has usually been controlled in the past, except during recent ER visits.         OBJECTIVE:       06/20/2023    1:41 PM  Depression screen PHQ 2/9  Decreased Interest 0  Down, Depressed, Hopeless 0  PHQ - 2 Score 0     BP Readings from Last 3 Encounters:  05/04/24 (!) 146/86  05/02/24 (!) 152/89  12/19/23 118/76    BP (!) 146/86   Pulse 77   Wt 128 lb 9.6 oz (58.3 kg)   SpO2 98%   BMI 23.52 kg/m      Physical Exam  Inspection reveals no gross abnormalities of the right foot.  There is significant tenderness to palpation on  the lateral border at the base of the fifth and this tenderness resumes distally and proximally about an inch.  No significant swelling noted over this area.  No erythema. ASSESSMENT/PLAN:   Assessment & Plan Right foot pain  Type 2 diabetes mellitus with diabetic neuropathy, without long-term current use of insulin  (HCC)  Diabetes mellitus treated with oral medication (HCC)    Assessment and Plan    Right foot pain with suspected fracture and inflammation Persistent tenderness and inability to bear weight suggest possible hairline fracture despite no visible fracture on x-ray. Will treat for possible fx given severity of symptoms.  - Prescribed meloxicam  15 mg once daily for 10 days. - Advised wearing a camboot for 3-4 weeks. - Scheduled follow-up in 4 weeks for re-evaluation and possible repeat x-rays or MRI.  Type 2 diabetes mellitus Well-controlled with Jardiance  and Ozempic . Significant weight loss noted, improving glycemic control. - Continue Jardiance  and Ozempic .  Hypertension Elevated blood pressure today, likely due to pain or stress from foot injury. Typically well-controlled. - Monitor blood pressure and reassess in 4 weeks.         Aimee Sharp A. Vita MD [facility]

## 2024-05-15 ENCOUNTER — Telehealth: Payer: Self-pay | Admitting: *Deleted

## 2024-05-15 NOTE — Progress Notes (Signed)
 Complex Care Management Note Care Guide Note  05/15/2024 Name: Aimee Sharp MRN: [id number] DOB: [birth year]   Complex Care Management Outreach Attempts: An unsuccessful telephone outreach was attempted today to offer the patient information about available complex care management services.  Follow Up Plan:  Additional outreach attempts will be made to offer the patient complex care management information and services.   Encounter Outcome:  No Answer  [facility] HealthPopulation Health Care Guide  Direct Dial:[phone] Fax:[phone] Website: [email or web address]

## 2024-05-19 ENCOUNTER — Telehealth: Payer: Self-pay | Admitting: *Deleted

## 2024-05-19 NOTE — Progress Notes (Signed)
 Complex Care Management Note Care Guide Note  05/19/2024 Name: Harper Vandervoort MRN: [id number] DOB: 1955-09-13   Complex Care Management Outreach Attempts: An unsuccessful telephone outreach was attempted today to offer the patient information about available complex care management services.  Follow Up Plan:  Additional outreach attempts will be made to offer the patient complex care management information and services.   Encounter Outcome:  No Answer [facility] HealthPopulation Health Care Guide  Direct [phone] Fax:[phone] Website: [email or web address]

## 2024-05-21 ENCOUNTER — Telehealth: Payer: Self-pay | Admitting: *Deleted

## 2024-05-21 NOTE — Progress Notes (Signed)
 Complex Care Management Note Care Guide Note  05/21/2024 Name: Aimee Sharp MRN: [id number] DOB: [birth year]   Complex Care Management Outreach Attempts: A third unsuccessful outreach was attempted today to offer the patient with information about available complex care management services.  Follow Up Plan:  no Additional outreach attempts will be made to offer the patient complex care management information and services.   Encounter Outcome:  No Answer  [facility] HealthPopulation Health Care Guide  Direct Dial:[phone] Fax:[phone] Website: [email or web address]

## 2024-06-02 ENCOUNTER — Other Ambulatory Visit: Payer: Self-pay | Admitting: Family Medicine

## 2024-06-02 DIAGNOSIS — M79661 Pain in right lower leg: Secondary | ICD-10-CM

## 2024-06-05 ENCOUNTER — Other Ambulatory Visit (HOSPITAL_COMMUNITY): Payer: Self-pay

## 2024-06-05 ENCOUNTER — Encounter: Payer: Self-pay | Admitting: Family Medicine

## 2024-06-05 ENCOUNTER — Ambulatory Visit: Admitting: Family Medicine

## 2024-06-05 ENCOUNTER — Telehealth: Payer: Self-pay | Admitting: Pharmacy Technician

## 2024-06-05 ENCOUNTER — Ambulatory Visit: Admitting: Nurse Practitioner

## 2024-06-05 VITALS — BP 142/80 | HR 84 | Wt 128.2 lb

## 2024-06-05 DIAGNOSIS — E1140 Type 2 diabetes mellitus with diabetic neuropathy, unspecified: Secondary | ICD-10-CM | POA: Diagnosis not present

## 2024-06-05 DIAGNOSIS — M79671 Pain in right foot: Secondary | ICD-10-CM

## 2024-06-05 DIAGNOSIS — I10 Essential (primary) hypertension: Secondary | ICD-10-CM

## 2024-06-05 LAB — POCT GLYCOSYLATED HEMOGLOBIN (HGB A1C): Hemoglobin A1C: 6.6 % — AB (ref 4.0–5.6)

## 2024-06-05 MED ORDER — IRBESARTAN-HYDROCHLOROTHIAZIDE 300-12.5 MG PO TABS: 1.0000 | ORAL_TABLET | Freq: Every day | ORAL | 1 refills | Status: AC

## 2024-06-05 MED ORDER — MELOXICAM 15 MG PO TABS: 15.0000 mg | ORAL_TABLET | Freq: Every day | ORAL | 0 refills | Status: AC

## 2024-06-05 MED ORDER — TIRZEPATIDE 2.5 MG/0.5ML ~~LOC~~ SOAJ: 2.5000 mg | SUBCUTANEOUS | 1 refills | Status: AC

## 2024-06-05 NOTE — Telephone Encounter (Signed)
 Pharmacy Patient Advocate Encounter  Received notification from [facility] [employer] that Prior Authorization for Mounjaro 2.5MG /0.5ML auto-injectors has been APPROVED from 06/05/24 to 06/05/25. Ran test claim, Copay is $0.00. This test claim was processed through [facility]- copay amounts may vary at other pharmacies due to pharmacy/plan contracts, or as the patient moves through the different stages of their insurance plan.   PA #/Case ID/Reference #: [id number]

## 2024-06-05 NOTE — Progress Notes (Signed)
" ° °  Name: Aimee Sharp   Date of Visit: 06/05/2024   Date of last visit with me: 05/04/2024   CHIEF COMPLAINT:  Chief Complaint  Patient presents with   other    4 week f/u on rt. Foot pain, talk about Ozempic  company no longer covers it.        HPI:  Discussed the use of AI scribe software for clinical note transcription with the patient, who gave verbal consent to proceed.  History of Present Illness   Aimee Sharp is a 69 year old female with hypertension who presents for follow-up on blood pressure management and medication review.  She has been experiencing elevated blood pressure readings during recent visits. She is currently taking Avalide, a combination pill for blood pressure, at a dose of 150 mg/12.5 mg. She is unsure if her blood pressure is high at home, as she does not regularly monitor it.  Her foot is almost fully recovered, with only occasional tightness across the area. Long walks can still cause some discomfort, which she manages with icing and meloxicam  as needed.  Her insurance no longer covers Ozempic , which she was previously receiving directly from the company. Her last A1c was 6.2. She was previously told she is no longer diabetic, which has affected her medication coverage.         OBJECTIVE:       06/20/2023    1:41 PM  Depression screen PHQ 2/9  Decreased Interest 0  Down, Depressed, Hopeless 0  PHQ - 2 Score 0     BP Readings from Last 3 Encounters:  06/05/24 (!) 150/80  05/04/24 (!) 146/86  05/02/24 (!) 152/89    BP (!) 150/80   Pulse 84   Wt 128 lb 3.2 oz (58.2 kg)   BMI 23.45 kg/m    Physical Exam          Physical Exam Constitutional:      Appearance: Normal appearance.  Cardiovascular:     Rate and Rhythm: Normal rate and regular rhythm.  Neurological:     General: No focal deficit present.     Mental Status: She is alert and oriented to person, place, and time. Mental status is at baseline.     ASSESSMENT/PLAN:    Assessment & Plan Right foot pain  Type 2 diabetes mellitus with diabetic neuropathy, without long-term current use of insulin  (HCC)  Primary hypertension    Assessment and Plan    Right foot pain Improving with occasional discomfort after long walks. - Advised icing if discomfort occurs. - Prescribed meloxicam  as needed.  Primary hypertension Blood pressure elevated. Current medication Avalide has limited dosing options. Plan to increase dosage for better control. - Inc. increase Avalide to 300-12.5. - Scheduled follow-up with Dr. Joyce on January 24th.  Type 2 diabetes mellitus with diabetic neuropathy Previously diabetic, now nondiabetic with last A1c at 6.2. Insurance does not cover Ozempic  for nondiabetics. Plan to reassess blood sugar levels for medication coverage eligibility. - Repeated blood sugar tests. - Will evaluate Ozempic  coverage eligibility based on results.         Lister Sharp A. Vita MD [facility] "

## 2024-06-05 NOTE — Telephone Encounter (Signed)
 Pharmacy Patient Advocate Encounter   Received notification from Onbase that prior authorization for Mounjaro 2.5MG /0.5ML auto-injectors  is required/requested.   Insurance verification completed.   The patient is insured through [facility] ADVANTAGE/RX ADVANCE.   Per test claim: PA required; PA submitted to above mentioned insurance via Latent Key/confirmation #[id number] Status is pending

## 2024-06-08 ENCOUNTER — Other Ambulatory Visit (HOSPITAL_COMMUNITY): Payer: Self-pay

## 2024-06-11 ENCOUNTER — Other Ambulatory Visit (INDEPENDENT_AMBULATORY_CARE_PROVIDER_SITE_OTHER)

## 2024-06-11 VITALS — BP 124/81

## 2024-06-11 DIAGNOSIS — I10 Essential (primary) hypertension: Secondary | ICD-10-CM

## 2024-06-11 DIAGNOSIS — E1140 Type 2 diabetes mellitus with diabetic neuropathy, unspecified: Secondary | ICD-10-CM

## 2024-06-11 NOTE — Progress Notes (Signed)
 "  06/11/2024 Name: Aimee Sharp MRN: [id number] DOB: [birth year]  Chief Complaint  Patient presents with   Medication Management   Medication Assistance   Diabetes    Aimee Sharp is a 69 y.o. year old female who presented for a telephone visit.   They were referred to the pharmacist by patient reqeust for assistance in managing medication access.    Subjective:  Care Team: Primary Care Provider: Joyce Norleen BROCKS, MD   Medication Access/Adherence  Current Pharmacy:  [facility] [address] [place]), [place] - [address] [place] [address] Phone: [phone] Fax: [phone]  [facility] [place], [place] - [address] [place]S [place] [address] Phone: [phone] Fax: [phone]  [facility] - [place], [place] - [address] [place] [address] Phone: [phone] Fax: [phone]   Patient reports affordability concerns with their medications: Yes  - worried about how she will be able to stay on Ozempic  for 2026 with the assistance program ending Patient reports access/transportation concerns to their pharmacy: No  Patient reports adherence concerns with their medications:  No     Medication Access -Jardiance  PAP approved for 2026, patient is receiving for free. -Has two months of Ozempic  PAP on hand, is not sure what she is going to do once she runs out -Has been checking BP at home since BP med increased, last reading was 124/81  Objective:  Lab Results  Component Value Date   HGBA1C 6.6 (A) 06/05/2024    Lab Results  Component Value Date   CREATININE 0.82 12/19/2023   BUN 10 12/19/2023   NA 143 12/19/2023   K 3.2 (L) 12/19/2023   CL 103 12/19/2023   CO2 24 12/19/2023    Lab Results  Component Value Date   CHOL 106 12/19/2023   HDL 37 (L) 12/19/2023   LDLCALC 50 12/19/2023   TRIG 100 12/19/2023   CHOLHDL 2.9 12/19/2023     Medications Reviewed Today     Reviewed by Lionell Jon DEL, RPH (Pharmacist) on 06/11/24 at 1015  Med List Status: <None>   Medication Order Taking? Sig Documenting Provider Last Dose Status Informant  atorvastatin  (LIPITOR) 20 MG tablet [id number] Yes Take 1 tablet by mouth once daily Lalonde, John C, MD  Active   Blood Glucose Monitoring Suppl (ACCU-CHEK AVIVA PLUS) w/Device KIT [id number] Yes Accu check Aviva plus machine. To check blood sugar 3-4 times a day. With test strips Joyce Norleen BROCKS, MD  Active   glucose blood test strip [id number] Yes Aviva plus test strips, testing 3-4 times a day. Joyce Norleen BROCKS, MD  Active   glucose blood test strip [id number] Yes USE AS DIRECTED ONCE DAILY Lalonde, John C, MD  Active   irbesartan -hydrochlorothiazide  (AVALIDE) 300-12.5 MG tablet [id number]  Take 1 tablet by mouth daily. Vita Morrow, MD  Active   JARDIANCE  25 MG TABS tablet [id number] Yes TAKE ONE TABLET BY MOUTH DAILY Lalonde, John C, MD  Active   meloxicam  (MOBIC ) 15 MG tablet [id number]  Take 1 tablet (15 mg total) by mouth daily. Vita Morrow, MD  Active   nortriptyline  (PAMELOR ) 10 MG capsule [id number] Yes Take 1 capsule by mouth at bedtime Vita Morrow, MD  Active   [facility] Lancets 33G MISC [id number] Yes Test once a daily Bulah Alm GORMAN DEVONNA  Active     Discontinued 06/05/24 9090   tirzepatide  (MOUNJARO ) 2.5 MG/0.5ML Pen [id number]  Inject 2.5 mg into the skin once a week. Vita Morrow, MD  Active               Assessment/Plan:   Medication Access -Discussed with patient that provider sent in Mounjaro  for her to [facility] last week to replace Ozempic  once she runs out, and should be available for pick up at no charge -Continue to monitor BP at home once weekly and keep a log -Start mounjaro  one week after last ozempic  injection   Follow Up Plan: 2 months  Jon VEAR Lindau, PharmD Clinical Pharmacist [phone]  "

## 2024-06-23 ENCOUNTER — Ambulatory Visit: Payer: PPO

## 2024-06-30 ENCOUNTER — Other Ambulatory Visit: Payer: Self-pay | Admitting: Family Medicine

## 2024-06-30 DIAGNOSIS — M79661 Pain in right lower leg: Secondary | ICD-10-CM

## 2024-07-01 ENCOUNTER — Telehealth: Payer: Self-pay

## 2024-07-01 NOTE — Telephone Encounter (Signed)
 Copied from CRM [id number]. Topic: General - Other >> Jul 01, 2024 10:56 AM Frederich PARAS wrote: Reason for CRM: pt called to reach Chloeanne Poteet, reached out to CAL she was not avail. Please have Carlo give the pt a call asap .  Returned phone call to patient.

## 2024-07-28 ENCOUNTER — Ambulatory Visit: Payer: PPO | Admitting: Family Medicine

## 2024-08-06 ENCOUNTER — Ambulatory Visit
